# Patient Record
Sex: Female | Born: 1985
Health system: Southern US, Community
[De-identification: ages and names within clinical notes are randomized; demographics above are authoritative.]

## PROBLEM LIST (undated history)

## (undated) ENCOUNTER — Inpatient Hospital Stay (HOSPITAL_COMMUNITY): Payer: Self-pay

## (undated) DIAGNOSIS — R87629 Unspecified abnormal cytological findings in specimens from vagina: Secondary | ICD-10-CM

## (undated) DIAGNOSIS — R87619 Unspecified abnormal cytological findings in specimens from cervix uteri: Secondary | ICD-10-CM

## (undated) DIAGNOSIS — IMO0002 Reserved for concepts with insufficient information to code with codable children: Secondary | ICD-10-CM

## (undated) DIAGNOSIS — R569 Unspecified convulsions: Secondary | ICD-10-CM

## (undated) DIAGNOSIS — Z8619 Personal history of other infectious and parasitic diseases: Secondary | ICD-10-CM

## (undated) DIAGNOSIS — Z789 Other specified health status: Secondary | ICD-10-CM

## (undated) DIAGNOSIS — B999 Unspecified infectious disease: Secondary | ICD-10-CM

## (undated) HISTORY — DX: Reserved for concepts with insufficient information to code with codable children: IMO0002

## (undated) HISTORY — DX: Unspecified infectious disease: B99.9

## (undated) HISTORY — DX: Unspecified convulsions: R56.9

## (undated) HISTORY — PX: SUPERFICIAL LYMPH NODE BIOPSY / EXCISION: SUR127

## (undated) HISTORY — DX: Unspecified abnormal cytological findings in specimens from cervix uteri: R87.619

## (undated) HISTORY — PX: TONSILLECTOMY: SUR1361

## (undated) HISTORY — PX: OTHER SURGICAL HISTORY: SHX169

## (undated) HISTORY — PX: APPENDECTOMY: SHX54

## (undated) HISTORY — DX: Personal history of other infectious and parasitic diseases: Z86.19

## (undated) HISTORY — DX: Unspecified abnormal cytological findings in specimens from vagina: R87.629

---
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
## 2005-10-26 DIAGNOSIS — B999 Unspecified infectious disease: Secondary | ICD-10-CM

## 2005-10-26 HISTORY — DX: Unspecified infectious disease: B99.9

## 2012-10-26 NOTE — L&D Delivery Note (Signed)
Delivery Note At 9:11 PM a viable female, "Addison", was delivered via Vaginal, Spontaneous Delivery (Presentation: ;  ).  APGAR: 9, 9; weight .   Placenta status:  Spontaneous, intact .  Cord: 3 vessels with the following complications: CAN x 1, reduced over shoulders at delivery.  None.  Cord pH: NA  Anesthesia: None  Episiotomy: None Lacerations: Small right periurethral skin split--not bleeding, no repair required.  Left vaginal introital abrasion--not bleeding, no repair required. Suture Repair: None Est. Blood Loss (mL): 300 cc  Mom to postpartum.  Baby to skin to skin. Placenta to path due to growth lag.Marland Kitchen  Nigel Bridgeman 07/28/2013, 9:43 PM

## 2012-11-29 ENCOUNTER — Ambulatory Visit: Payer: BC Managed Care – PPO | Admitting: Obstetrics and Gynecology

## 2012-11-29 ENCOUNTER — Encounter: Payer: Self-pay | Admitting: Obstetrics and Gynecology

## 2012-11-29 DIAGNOSIS — O26849 Uterine size-date discrepancy, unspecified trimester: Secondary | ICD-10-CM

## 2012-11-29 DIAGNOSIS — Z331 Pregnant state, incidental: Secondary | ICD-10-CM

## 2012-11-29 LAB — OB RESULTS CONSOLE ABO/RH: RH Type: POSITIVE

## 2012-11-29 LAB — OB RESULTS CONSOLE HEPATITIS B SURFACE ANTIGEN: Hepatitis B Surface Ag: NEGATIVE

## 2012-11-29 LAB — POCT URINALYSIS DIPSTICK
Glucose, UA: NEGATIVE
Ketones, UA: NEGATIVE
Leukocytes, UA: NEGATIVE
Spec Grav, UA: <=1.005

## 2012-11-29 LAB — OB RESULTS CONSOLE ANTIBODY SCREEN: Antibody Screen: NEGATIVE

## 2012-11-29 NOTE — Progress Notes (Signed)
NOB interview.  States is currently in [employer] and needs restrictions and [facility] letters  Which were given.U/S for viability sched per protocol due to hSAB. NOB W/U with VL  12/22/12.

## 2012-11-30 LAB — GC/CHLAMYDIA PROBE AMP, URINE
Chlamydia, Swab/Urine, PCR: NEGATIVE
GC Probe Amp, Urine: NEGATIVE

## 2012-11-30 LAB — PRENATAL PANEL VII
Antibody Screen: NEGATIVE
Basophils Absolute: 0 10*3/uL (ref 0.0–0.1)
HIV: NONREACTIVE
Hepatitis B Surface Ag: NEGATIVE
Lymphocytes Relative: 33 % (ref 12–46)
Lymphs Abs: 2.5 10*3/uL (ref 0.7–4.0)
MCV: 90.8 fL (ref 78.0–100.0)
Neutro Abs: 4.4 10*3/uL (ref 1.7–7.7)
Neutrophils Relative %: 60 % (ref 43–77)
Platelets: 237 10*3/uL (ref 150–400)
RBC: 4.48 MIL/uL (ref 3.87–5.11)
RDW: 14.3 % (ref 11.5–15.5)
Rubella: 4.9 Index — ABNORMAL HIGH (ref <0.90)
WBC: 7.4 10*3/uL (ref 4.0–10.5)

## 2012-12-06 ENCOUNTER — Other Ambulatory Visit: Payer: BC Managed Care – PPO

## 2012-12-07 ENCOUNTER — Ambulatory Visit: Payer: BC Managed Care – PPO | Admitting: Obstetrics and Gynecology

## 2012-12-07 ENCOUNTER — Ambulatory Visit: Payer: BC Managed Care – PPO

## 2012-12-07 ENCOUNTER — Encounter: Payer: Self-pay | Admitting: Obstetrics and Gynecology

## 2012-12-07 ENCOUNTER — Other Ambulatory Visit: Payer: Self-pay | Admitting: Obstetrics and Gynecology

## 2012-12-07 VITALS — BP 102/64 | Wt 162.0 lb

## 2012-12-07 DIAGNOSIS — O26841 Uterine size-date discrepancy, first trimester: Secondary | ICD-10-CM

## 2012-12-07 DIAGNOSIS — Z331 Pregnant state, incidental: Secondary | ICD-10-CM

## 2012-12-07 DIAGNOSIS — O26849 Uterine size-date discrepancy, unspecified trimester: Secondary | ICD-10-CM

## 2012-12-07 LAB — POCT WET PREP (WET MOUNT)

## 2012-12-07 NOTE — Progress Notes (Signed)
[redacted]w[redacted]d unable to leave urine sample. Will give before leaving office.  No complaints.  U/s Comments: singleton pregnancy, [redacted]w[redacted]d IUP, yolk sac seen, normal ovaries, no fluid in CDS, normal adnexa. Suggest EDD be changed by todays u/s discrepancy in LMP EDD

## 2012-12-07 NOTE — Progress Notes (Signed)
[facility] NEW OB EXAMINATION   Elizabeth Chapman is a 27 y.o. female, G2P0010, who presents at [redacted]w[redacted]d gestation for a new obstetrical examination.  The patient reports that she has a history of irregular ovulation.  Her due date is August 01, 2013 based on an ultrasound today which showed a 6 week 1 day gestation.  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  The following portions of the patient's history were reviewed and updated as appropriate: allergies, current medications, past family history, past medical history, past social history, past surgical history and problem list.  OB History   Grav Para Term Preterm Abortions TAB SAB Ect Mult Living   2    1  1          Past Medical History  Diagnosis Date  . Abnormal Pap smear AGE 83    COLPO; LAST PAP 2011  . Infection     UTI X 1  . Infection 2007    X 1    Past Surgical History  Procedure Laterality Date  . Appendectomy  AGE 56  . Lymph nodes removed from neck  AGE 56  . Tonsillectomy  AGE 23    Family History  Problem Relation Age of Onset  . Early death Father     WORK ACCIDENT  . Learning disabilities Sister   . Epilepsy Brother     DUE TO CAR ACCIDENT    Social History:  reports that she quit smoking about 6 years ago. Her smoking use included Cigarettes. She smoked 0.30 packs per day. She has never used smokeless tobacco. She reports that  drinks alcohol. She reports that she does not use illicit drugs.  Allergies: No Known Allergies  Medications: prenatal vitamins   Objective:    BP 102/64  Wt 162 lb (73.483 kg)  LMP 10/14/2012    Weight:  Wt Readings from Last 1 Encounters:  12/07/12 162 lb (73.483 kg)          BMI: There is no height on file to calculate BMI.  General Appearance: Alert, appropriate appearance for age. No acute distress HEENT: Grossly normal Neck / Thyroid: Supple, no masses, nodes or enlargement Lungs: clear to auscultation  bilaterally Back: No CVA tenderness Breast Exam: No masses or nodes.No dimpling, nipple retraction or discharge. Cardiovascular: Regular rate and rhythm. S1, S2, no murmur Gastrointestinal: Soft, non-tender, no masses or organomegaly.                               Fundal height: 6 weeks                               Fetal heart tones audible: no  ++++++++++++++++++++++++++++++++++++++++++++++++++++++++  Pelvic Exam: External genitalia: normal general appearance Vaginal: normal without tenderness, induration or masses and relaxation: No Cervix: normal appearance Adnexa: normal bimanual exam Uterus: gravid, nontender, 6 weeks size  ++++++++++++++++++++++++++++++++++++++++++++++++++++++++  Lymphatic Exam: Non-palpable nodes in neck, clavicular, axillary, or inguinal regions Neurologic: Normal speech, no tremor  Psychiatric: Alert and oriented, appropriate affect.  Prenatal labs: ABO, Rh: O/POS/-- (02/04 1134) Antibody: NEG (02/04 1134) Rubella:  immune RPR: NON REAC (02/04 1134)  HBsAg: NEGATIVE (02/04 1134)  HIV: NON REACTIVE (02/04 1134)  GBS:   pending until the third trimester Hemoglobin: 14.2 Platelets: 237,000 Gonorrhea: Negative Chlamydia: Negative Urine culture: Negative Ultrasound  today: December 07, 2012 single gestation, fetal heart rate 101 bpm, normal ovaries, no fluid in the cul-de-sac.  Wet Prep:   Previously done:            no                     If no: Whiff:                     Positive slight                              Clue cells:             no                              PH:                        4.5                              Yeast:                    no                              Trichomoniasis:    no  Urine analysis:     Negative    Assessment:   27 y.o. female G2P0010 at [redacted]w[redacted]d gestation ( EDC is August 01, 2013) by:  Ultrasound:                               yes                                Military service  Past history of  cigarette smoking   Plan:    Pap smear sent.  Our office will complete appropriate military paperwork.  We discussed routine pregnancy issues:  Toxoplasmosis was reviewed.  The patient was told to avoid cat liter boxes and feces.  The patient was told to avoid predator fish including tuna because of our concerns for mercury consumption.  The patient was told to avoid soft cheeses.  The patient was told to be sure that all lunch meats are well cooked.  Our model for pregnancy management was reviewed.  Proper diet and exercise reviewed.  Return to office in 4 weeks.  Medications include:  Prenatal vitamins  Mylinda Latina.D.

## 2012-12-09 LAB — PAP IG W/ RFLX HPV ASCU

## 2012-12-10 ENCOUNTER — Other Ambulatory Visit: Payer: Self-pay

## 2012-12-15 LAB — US OB TRANSVAGINAL

## 2012-12-15 LAB — US OB COMP LESS 14 WKS

## 2012-12-22 ENCOUNTER — Encounter: Payer: BC Managed Care – PPO | Admitting: Obstetrics and Gynecology

## 2013-01-05 ENCOUNTER — Ambulatory Visit: Payer: BC Managed Care – PPO

## 2013-01-05 ENCOUNTER — Ambulatory Visit: Payer: BC Managed Care – PPO | Admitting: Obstetrics and Gynecology

## 2013-07-06 LAB — OB RESULTS CONSOLE GBS: GBS: NEGATIVE

## 2013-07-23 ENCOUNTER — Encounter (HOSPITAL_COMMUNITY): Payer: Self-pay | Admitting: Family

## 2013-07-23 ENCOUNTER — Inpatient Hospital Stay (HOSPITAL_COMMUNITY)
Admission: AD | Admit: 2013-07-23 | Discharge: 2013-07-23 | Disposition: A | Discharge status: Home / Self Care | Payer: BC Managed Care – PPO | Source: Ambulatory Visit | Attending: Obstetrics and Gynecology | Admitting: Obstetrics and Gynecology

## 2013-07-23 DIAGNOSIS — R51 Headache: Secondary | ICD-10-CM | POA: Insufficient documentation

## 2013-07-23 DIAGNOSIS — R030 Elevated blood-pressure reading, without diagnosis of hypertension: Secondary | ICD-10-CM | POA: Insufficient documentation

## 2013-07-23 DIAGNOSIS — O99891 Other specified diseases and conditions complicating pregnancy: Secondary | ICD-10-CM | POA: Insufficient documentation

## 2013-07-23 LAB — CBC
HCT: 33.4 % — ABNORMAL LOW (ref 36.0–46.0)
Hemoglobin: 11.8 g/dL — ABNORMAL LOW (ref 12.0–15.0)
MCH: 31.4 pg (ref 26.0–34.0)
MCHC: 35.3 g/dL (ref 30.0–36.0)
MCV: 88.8 fL (ref 78.0–100.0)

## 2013-07-23 LAB — URINALYSIS, ROUTINE W REFLEX MICROSCOPIC
Bilirubin Urine: NEGATIVE
Hgb urine dipstick: NEGATIVE
Ketones, ur: NEGATIVE mg/dL
Nitrite: NEGATIVE
pH: 6.5 (ref 5.0–8.0)

## 2013-07-23 LAB — COMPREHENSIVE METABOLIC PANEL
BUN: 13 mg/dL (ref 6–23)
Calcium: 9.8 mg/dL (ref 8.4–10.5)
Creatinine, Ser: 0.94 mg/dL (ref 0.50–1.10)
GFR calc Af Amer: >90 mL/min (ref >90)
GFR calc non Af Amer: 82 mL/min — ABNORMAL LOW (ref >90)
Glucose, Bld: 95 mg/dL (ref 70–99)
Sodium: 138 mEq/L (ref 135–145)
Total Protein: 5.8 g/dL — ABNORMAL LOW (ref 6.0–8.3)

## 2013-07-23 LAB — PROTEIN / CREATININE RATIO, URINE
Protein Creatinine Ratio: 0.14 (ref 0.00–0.15)
Total Protein, Urine: 5.6 mg/dL

## 2013-07-23 LAB — LACTATE DEHYDROGENASE: LDH: 152 U/L (ref 94–250)

## 2013-07-23 LAB — URIC ACID: Uric Acid, Serum: 5.9 mg/dL (ref 2.4–7.0)

## 2013-07-23 MED ORDER — HYDROCODONE-ACETAMINOPHEN 5-325 MG PO TABS
1.0000 | ORAL_TABLET | Freq: Once | ORAL | Status: AC
  Administered 2013-07-23: 1 via ORAL
  Filled 2013-07-23: qty 1

## 2013-07-23 NOTE — MAU Provider Note (Signed)
History   27yo, G2P0 at [redacted]w[redacted]d presents with HA since Friday generally unrelieved by tylenol, though had some relief yesterday.  Pt reports that her BP had been closely monitored in the office and that [facility] labs had been drawn 9/23.  Denies visual changes and right epigastric pain.  Denies VB, UCs, LOF, recent fever, resp or GI c/o's,UTI s/s . GFM.   Chief Complaint  Patient presents with  . Headache    OB History   Grav Para Term Preterm Abortions TAB SAB Ect Mult Living   2    1  1          Past Medical History  Diagnosis Date  . Abnormal Pap smear AGE 87    COLPO; LAST PAP 2011  . Infection     UTI X 1  . Infection 2007    X 1    Past Surgical History  Procedure Laterality Date  . Appendectomy  AGE 59  . Lymph nodes removed from neck  AGE 59  . Tonsillectomy  AGE 80    Family History  Problem Relation Age of Onset  . Early death Father     WORK ACCIDENT  . Learning disabilities Sister   . Epilepsy Brother     DUE TO CAR ACCIDENT    History  Substance Use Topics  . Smoking status: Former Smoker -- 0.30 packs/day    Types: Cigarettes    Quit date: 10/26/2006  . Smokeless tobacco: Never Used  . Alcohol Use: Yes     Comment: OCC    Allergies: No Known Allergies  Prescriptions prior to admission  Medication Sig Dispense Refill  . acetaminophen (TYLENOL) 500 MG tablet Take 1,000 mg by mouth every 6 (six) hours as needed for pain.      . Prenatal Vit-Fe Sulfate-FA (PRENATAL VITAMIN PO) Take 1 tablet by mouth daily. OTC        ROS: see HPI above, all other systems are negative   Physical Exam   Blood pressure 122/74, pulse 76, temperature 97.5 F (36.4 C), temperature source Oral, resp. rate 16, last menstrual period 10/14/2012.  Chest: Clear Heart: RRR Abdomen: gravid, NT Extremities: WNL  FHT: UCs:  Recent Results (from the past 2160 hour(s))  URINALYSIS, ROUTINE W REFLEX MICROSCOPIC     Status: Abnormal   Collection Time    07/23/13 10:41 AM       Result Value Range   Color, Urine YELLOW  YELLOW   APPearance CLEAR  CLEAR   Specific Gravity, Urine <1.005 (*) 1.005 - 1.030   pH 6.5  5.0 - 8.0   Glucose, UA NEGATIVE  NEGATIVE mg/dL   Hgb urine dipstick NEGATIVE  NEGATIVE   Bilirubin Urine NEGATIVE  NEGATIVE   Ketones, ur NEGATIVE  NEGATIVE mg/dL   Protein, ur NEGATIVE  NEGATIVE mg/dL   Urobilinogen, UA 0.2  0.0 - 1.0 mg/dL   Nitrite NEGATIVE  NEGATIVE   Leukocytes, UA NEGATIVE  NEGATIVE   Comment: MICROSCOPIC NOT DONE ON URINES WITH NEGATIVE PROTEIN, BLOOD, LEUKOCYTES, NITRITE, OR GLUCOSE <1000 mg/dL.  PROTEIN / CREATININE RATIO, URINE     Status: None   Collection Time    07/23/13 10:41 AM      Result Value Range   Creatinine, Urine 39.43     Total Protein, Urine 5.6     Comment: NO NORMAL RANGE ESTABLISHED FOR THIS TEST   PROTEIN CREATININE RATIO 0.14  0.00 - 0.15   Comment: Performed at [facility]  CBC     Status: Abnormal   Collection Time    07/23/13 11:17 AM      Result Value Range   WBC 10.4  4.0 - 10.5 K/uL   RBC 3.76 (*) 3.87 - 5.11 MIL/uL   Hemoglobin 11.8 (*) 12.0 - 15.0 g/dL   HCT 45.4 (*) 09.8 - 11.9 %   MCV 88.8  78.0 - 100.0 fL   MCH 31.4  26.0 - 34.0 pg   MCHC 35.3  30.0 - 36.0 g/dL   RDW 14.7  82.9 - 56.2 %   Platelets 164  150 - 400 K/uL  COMPREHENSIVE METABOLIC PANEL     Status: Abnormal   Collection Time    07/23/13 11:17 AM      Result Value Range   Sodium 138  135 - 145 mEq/L   Potassium 4.4  3.5 - 5.1 mEq/L   Chloride 103  96 - 112 mEq/L   CO2 23  19 - 32 mEq/L   Glucose, Bld 95  70 - 99 mg/dL   BUN 13  6 - 23 mg/dL   Creatinine, Ser 1.30  0.50 - 1.10 mg/dL   Calcium 9.8  8.4 - 86.5 mg/dL   Total Protein 5.8 (*) 6.0 - 8.3 g/dL   Albumin 2.7 (*) 3.5 - 5.2 g/dL   AST 18  0 - 37 U/L   ALT 10  0 - 35 U/L   Alkaline Phosphatase 101  39 - 117 U/L   Total Bilirubin 0.2 (*) 0.3 - 1.2 mg/dL   GFR calc non Af Amer 82 (*) >90 mL/min   GFR calc Af Amer >90  >90 mL/min   Comment: (NOTE)      The eGFR has been calculated using the CKD EPI equation.     This calculation has not been validated in all clinical situations.     eGFR's persistently <90 mL/min signify possible Chronic Kidney     Disease.  URIC ACID     Status: None   Collection Time    07/23/13 11:17 AM      Result Value Range   Uric Acid, Serum 5.9  2.4 - 7.0 mg/dL  LACTATE DEHYDROGENASE     Status: None   Collection Time    07/23/13 11:17 AM      Result Value Range   LDH 152  94 - 250 U/L   Filed Vitals:   07/23/13 1050 07/23/13 1101 07/23/13 1116 07/23/13 1131  BP: 151/87 126/80 134/89 122/74  Pulse: 67 69 85 76  Temp: 97.5 F (36.4 C)     TempSrc: Oral     Resp: 16      ED Course  IUP at [redacted]w[redacted]d Pre-eclampsia evaluation PIH labs WNL  D/C home with PIH precautions F/u 9/30 at already scheduled ROB    Haroldine Laws CNM, MSN 07/23/2013 2:27 PM

## 2013-07-23 NOTE — MAU Note (Signed)
27 yo, G2P0 at [redacted]w[redacted]d, presents to MAU with c/o headache since Friday. Reports some relief yesterday; unrelieved with 1000mg  Tylenol at 0700 today and ice pack to head. Patient reports she had elevated BPs in office visit on Tuesday.  Denies blurred vision, RUQ pain or other lateralizing s/s.  Denies VB, LOF, contractions. Reports +FM.

## 2013-07-28 ENCOUNTER — Inpatient Hospital Stay (HOSPITAL_COMMUNITY)
Admission: AD | Admit: 2013-07-28 | Discharge: 2013-07-28 | Disposition: A | Discharge status: Home / Self Care | Payer: BC Managed Care – PPO | Source: Ambulatory Visit | Attending: Obstetrics and Gynecology | Admitting: Obstetrics and Gynecology

## 2013-07-28 ENCOUNTER — Encounter (HOSPITAL_COMMUNITY): Payer: Self-pay | Admitting: *Deleted

## 2013-07-28 ENCOUNTER — Inpatient Hospital Stay (HOSPITAL_COMMUNITY)
Admission: AD | Admit: 2013-07-28 | Discharge: 2013-07-31 | DRG: 775 | Disposition: A | Discharge status: Home / Self Care | Payer: Medicaid Other | Source: Ambulatory Visit | Attending: Obstetrics and Gynecology | Admitting: Obstetrics and Gynecology

## 2013-07-28 DIAGNOSIS — O26849 Uterine size-date discrepancy, unspecified trimester: Secondary | ICD-10-CM | POA: Diagnosis present

## 2013-07-28 DIAGNOSIS — O99891 Other specified diseases and conditions complicating pregnancy: Secondary | ICD-10-CM | POA: Insufficient documentation

## 2013-07-28 DIAGNOSIS — O479 False labor, unspecified: Secondary | ICD-10-CM | POA: Insufficient documentation

## 2013-07-28 DIAGNOSIS — IMO0001 Reserved for inherently not codable concepts without codable children: Secondary | ICD-10-CM

## 2013-07-28 DIAGNOSIS — R030 Elevated blood-pressure reading, without diagnosis of hypertension: Secondary | ICD-10-CM | POA: Insufficient documentation

## 2013-07-28 HISTORY — DX: Other specified health status: Z78.9

## 2013-07-28 LAB — CBC
MCH: 31.9 pg (ref 26.0–34.0)
MCHC: 35.2 g/dL (ref 30.0–36.0)
Platelets: 197 10*3/uL (ref 150–400)
RBC: 3.89 MIL/uL (ref 3.87–5.11)

## 2013-07-28 LAB — TYPE AND SCREEN: Antibody Screen: NEGATIVE

## 2013-07-28 LAB — RPR: RPR Ser Ql: NONREACTIVE

## 2013-07-28 LAB — ABO/RH: ABO/RH(D): O POS

## 2013-07-28 MED ORDER — FENTANYL CITRATE 0.05 MG/ML IJ SOLN
100.0000 ug | INTRAMUSCULAR | Status: DC | PRN
  Administered 2013-07-28: 100 ug via INTRAVENOUS
  Filled 2013-07-28: qty 2

## 2013-07-28 MED ORDER — ACETAMINOPHEN 325 MG PO TABS: 650.0000 mg | ORAL_TABLET | ORAL | Status: DC | PRN

## 2013-07-28 MED ORDER — CITRIC ACID-SODIUM CITRATE 334-500 MG/5ML PO SOLN: 30.0000 mL | ORAL | Status: DC | PRN

## 2013-07-28 MED ORDER — OXYCODONE-ACETAMINOPHEN 5-325 MG PO TABS: 1.0000 | ORAL_TABLET | ORAL | Status: DC | PRN

## 2013-07-28 MED ORDER — LIDOCAINE HCL (PF) 1 % IJ SOLN
30.0000 mL | INTRAMUSCULAR | Status: DC | PRN
  Filled 2013-07-28 (×2): qty 30

## 2013-07-28 MED ORDER — LACTATED RINGERS IV SOLN: 500.0000 mL | INTRAVENOUS | Status: DC | PRN

## 2013-07-28 MED ORDER — ONDANSETRON HCL 4 MG/2ML IJ SOLN: 4.0000 mg | Freq: Four times a day (QID) | INTRAMUSCULAR | Status: DC | PRN

## 2013-07-28 MED ORDER — FLEET ENEMA 7-19 GM/118ML RE ENEM: 1.0000 | ENEMA | RECTAL | Status: DC | PRN

## 2013-07-28 MED ORDER — IBUPROFEN 600 MG PO TABS
600.0000 mg | ORAL_TABLET | Freq: Four times a day (QID) | ORAL | Status: DC | PRN
  Administered 2013-07-28: 600 mg via ORAL
  Filled 2013-07-28: qty 1

## 2013-07-28 MED ORDER — LACTATED RINGERS IV SOLN
INTRAVENOUS | Status: DC
  Administered 2013-07-28: 16:00:00 via INTRAVENOUS

## 2013-07-28 MED ORDER — OXYTOCIN 40 UNITS IN LACTATED RINGERS INFUSION - SIMPLE MED
62.5000 mL/h | INTRAVENOUS | Status: DC
  Filled 2013-07-28: qty 1000

## 2013-07-28 MED ORDER — ZOLPIDEM TARTRATE 5 MG PO TABS
10.0000 mg | ORAL_TABLET | Freq: Once | ORAL | Status: AC
  Administered 2013-07-28: 10 mg via ORAL
  Filled 2013-07-28 (×2): qty 1

## 2013-07-28 MED ORDER — ZOLPIDEM TARTRATE 10 MG PO TABS: 10.0000 mg | ORAL_TABLET | Freq: Once | ORAL | Status: DC

## 2013-07-28 MED ORDER — OXYTOCIN BOLUS FROM INFUSION: 500.0000 mL | INTRAVENOUS | Status: DC

## 2013-07-28 NOTE — MAU Provider Note (Signed)
  History     CSN: [id number]  Arrival date and time: 07/28/13 0350   None     Chief Complaint  Patient presents with  . Labor Eval   HPI Comments: Pt is a G2P0 at [redacted]w[redacted]d that arrives for labor check, states she's been having ctx since 5am yesterday and they have been 5 min apart since about 2am, also getting stronger. Pt denies any LOF or VB, +FM.      No past medical history on file.  No past surgical history on file.  No family history on file.  History  Substance Use Topics  . Smoking status: Not on file  . Smokeless tobacco: Not on file  . Alcohol Use: Not on file    Allergies: Allergies not on file  No prescriptions prior to admission    Review of Systems  All other systems reviewed and are negative.   Physical Exam   Blood pressure 132/76, pulse 76, temperature 98.6 F (37 C), temperature source Oral, resp. rate 20, height 5\' 3"  (1.6 m), weight 212 lb (96.163 kg), SpO2 100.00%.  Physical Exam  Nursing note and vitals reviewed. Constitutional: She is oriented to person, place, and time. She appears well-developed and well-nourished. No distress.  Cardiovascular: Normal rate.   Respiratory: Effort normal.  GI: Soft.  Genitourinary: Vagina normal.  Musculoskeletal: Normal range of motion. She exhibits edema.  Neurological: She is alert and oriented to person, place, and time. She has normal reflexes.  Skin: Skin is warm and dry.  Psychiatric: She has a normal mood and affect. Her behavior is normal.    MAU Course  Procedures    Assessment and Plan  IUP at [redacted]w[redacted]d FHR reactive cat 1 Toco irreg Cervix FT, no change after observation Initially elevated BP, but has normalized Hx growth restriction, AC 6th% on last [place] but overall growth normal at 65% W/u for pre-eclampsia normal on 9/23  Pt already scheduled for growth [place] and BPP on 10/7  ambien 10mg  PO given here and rx for #15 w 0RF given  Discharged home stable condition rv'd FKC and labor sx's    Lanae Federer M 07/28/2013, 5:59 AM

## 2013-07-28 NOTE — Progress Notes (Signed)
  Subjective: Breathing with contractions--received IV Fentanyl at 5:30pm with benefit.  "Feels like it's wearing off now".  Moderately anxious.  Objective: BP 136/80  Pulse 77  Temp(Src) 97.1 F (36.2 C) (Oral)  Resp 20  Ht 5\' 3"  (1.6 m)  Wt 212 lb (96.163 kg)  BMI 37.56 kg/m2  SpO2 98%      FHT:  Category 1 UC:   q 4 min, moderate SVE:   Dilation: 8 Effacement (%): 100 Station: -1 Exam by:: Emilee Hero CNM On initial exam, cervix fully dilated with BBOW, vtx at -1 station. AROM--clear fluid, with cervix reducing to 8 cm, 100%, vtx, -1  Assessment / Plan: Progressive labor. Will anticipate further descent and dilation.  Nigel Bridgeman 07/28/2013, 7:48 PM

## 2013-07-28 NOTE — H&P (Signed)
Elizabeth Chapman is a 27 y.o. female, G2P0010 at [redacted]w[redacted]d, presenting for active labor.  Denies VB, LOF, recent fever, resp or GI c/o's, UTI or PIH s/s. GFM. Desires epidural.  Patient Active Problem List   Diagnosis Date Noted  . Uterine size date discrepancy 07/28/2013  . Active labor at term 07/28/2013    History of present pregnancy: Patient entered care at 11 weeks.   EDC of 08/01/13 was established by LMP.   Anatomy scan:  20 weeks, with normal findings and an posterior placenta.   Additional [place] evaluations:  [redacted]w[redacted]d for growth S<D - AFI 60th%ile, EFW 35th%ile, AC 5th%ile Significant prenatal events:  none   Last evaluation:  07/25/13 at [redacted]w[redacted]d    No cervical exam  OB History   Grav Para Term Preterm Abortions TAB SAB Ect Mult Living   2    1  1         Past Medical History  Diagnosis Date  . Medical history non-contributory    Past Surgical History  Procedure Laterality Date  . Superficial lymph node biopsy / excision    . Appendectomy    . Tonsillectomy     Family History: family history is not on file. Social History:  reports that she quit smoking about 4 years ago. She does not have any smokeless tobacco history on file. She reports that she does not drink alcohol or use illicit drugs.   Prenatal Transfer Tool  Maternal Diabetes: No Genetic Screening: Declined Maternal Ultrasounds/Referrals: Normal Fetal Ultrasounds or other Referrals:  None Maternal Substance Abuse:  No Significant Maternal Medications:  None Significant Maternal Lab Results: Lab values include: Group B Strep negative    ROS: see HPI above, all other systems are negative  No Known Allergies   Dilation: 5.5 Effacement (%): 100 Station: -2 Exam by:: K.WIlson,RN Blood pressure 135/83, pulse 83, temperature 97.1 F (36.2 C), temperature source Oral, resp. rate 14, SpO2 98.00%.  Chest clear Heart RRR without murmur Abd gravid, NT Ext: WNL  FHR: Cat II UCs:  Q 2-5 min  Prenatal labs: ABO,  Rh:  O pos Antibody:  neg Rubella:   Immune RPR:   Neg HBsAg:   Neg HIV:   Neg GBS:  Neg Sickle cell/Hgb electrophoresis:  n/a Pap:  2/14 WNL GC:  Neg Chlamydia:  Neg Genetic screenings:  Declined Glucola:  101 Other:  PIH labs 9/28 and 9/23 - WNL    Assessment/Plan: IUP at [redacted]w[redacted]d Active labor GBS neg AC lag 5th%ile  Admit per c/w Dr. Estanislado Pandy as attending MD Routine [facility] orders Epidural prn   Rowan Blase, MSN 07/28/2013, 6:09 PM

## 2013-07-28 NOTE — MAU Note (Signed)
Pt here earlier this morning. given ambien. Did not sleep. Contractions worse now and thinks she lost her mucus plug

## 2013-07-28 NOTE — MAU Note (Signed)
contractions 

## 2013-07-28 NOTE — MAU Note (Signed)
PT SAYS SHE  HAS BEEN HURTING SINCE [place] LAST Tuesday-IN OFFICE -  DIDN'T SAY.   IS  [facility] THIS Tuesday FOR U/S- SAYS BABY IS SMALL.   Marland Kitchen DENIES HSV  AND MRSA.

## 2013-07-29 LAB — CBC
HCT: 30.2 % — ABNORMAL LOW (ref 36.0–46.0)
HCT: 32.7 % — ABNORMAL LOW (ref 36.0–46.0)
Hemoglobin: 10.7 g/dL — ABNORMAL LOW (ref 12.0–15.0)
Hemoglobin: 11.4 g/dL — ABNORMAL LOW (ref 12.0–15.0)
MCH: 32.2 pg (ref 26.0–34.0)
MCHC: 35.4 g/dL (ref 30.0–36.0)
MCV: 89.6 fL (ref 78.0–100.0)
MCV: 92.4 fL (ref 78.0–100.0)
Platelets: 189 10*3/uL (ref 150–400)
RBC: 3.37 MIL/uL — ABNORMAL LOW (ref 3.87–5.11)
RBC: 3.54 MIL/uL — ABNORMAL LOW (ref 3.87–5.11)
RDW: 14.3 % (ref 11.5–15.5)
WBC: 16 10*3/uL — ABNORMAL HIGH (ref 4.0–10.5)
WBC: 14.9 10*3/uL — ABNORMAL HIGH (ref 4.0–10.5)

## 2013-07-29 LAB — COMPREHENSIVE METABOLIC PANEL
AST: 29 U/L (ref 0–37)
Alkaline Phosphatase: 96 U/L (ref 39–117)
CO2: 25 mEq/L (ref 19–32)
Calcium: 9.3 mg/dL (ref 8.4–10.5)
Chloride: 99 mEq/L (ref 96–112)
Creatinine, Ser: 1.03 mg/dL (ref 0.50–1.10)
GFR calc Af Amer: 86 mL/min — ABNORMAL LOW (ref >90)
GFR calc non Af Amer: 74 mL/min — ABNORMAL LOW (ref >90)
Glucose, Bld: 103 mg/dL — ABNORMAL HIGH (ref 70–99)
Total Bilirubin: 0.2 mg/dL — ABNORMAL LOW (ref 0.3–1.2)
Total Protein: 6 g/dL (ref 6.0–8.3)

## 2013-07-29 LAB — PROTEIN / CREATININE RATIO, URINE
Creatinine, Urine: 204.2 mg/dL
Protein Creatinine Ratio: 0.08 (ref 0.00–0.15)

## 2013-07-29 LAB — URIC ACID: Uric Acid, Serum: 6 mg/dL (ref 2.4–7.0)

## 2013-07-29 MED ORDER — PRENATAL MULTIVITAMIN CH
1.0000 | ORAL_TABLET | Freq: Every day | ORAL | Status: DC
  Administered 2013-07-29 – 2013-07-31 (×3): 1 via ORAL
  Filled 2013-07-29 (×3): qty 1

## 2013-07-29 MED ORDER — DIPHENHYDRAMINE HCL 25 MG PO CAPS: 25.0000 mg | ORAL_CAPSULE | Freq: Four times a day (QID) | ORAL | Status: DC | PRN

## 2013-07-29 MED ORDER — DIBUCAINE 1 % RE OINT: 1.0000 "application " | TOPICAL_OINTMENT | RECTAL | Status: DC | PRN

## 2013-07-29 MED ORDER — BENZOCAINE-MENTHOL 20-0.5 % EX AERO: 1.0000 "application " | INHALATION_SPRAY | CUTANEOUS | Status: DC | PRN

## 2013-07-29 MED ORDER — INFLUENZA VAC SPLIT QUAD 0.5 ML IM SUSP
0.5000 mL | INTRAMUSCULAR | Status: AC
  Administered 2013-07-29: 0.5 mL via INTRAMUSCULAR

## 2013-07-29 MED ORDER — ONDANSETRON HCL 4 MG/2ML IJ SOLN: 4.0000 mg | INTRAMUSCULAR | Status: DC | PRN

## 2013-07-29 MED ORDER — IBUPROFEN 600 MG PO TABS
600.0000 mg | ORAL_TABLET | Freq: Four times a day (QID) | ORAL | Status: DC
  Administered 2013-07-29 – 2013-07-31 (×10): 600 mg via ORAL
  Filled 2013-07-29 (×9): qty 1

## 2013-07-29 MED ORDER — OXYCODONE-ACETAMINOPHEN 5-325 MG PO TABS
1.0000 | ORAL_TABLET | ORAL | Status: DC | PRN
Start: 2013-07-29 — End: 2013-07-31
  Administered 2013-07-30: 1 via ORAL
  Filled 2013-07-29: qty 1

## 2013-07-29 MED ORDER — WITCH HAZEL-GLYCERIN EX PADS: 1.0000 "application " | MEDICATED_PAD | CUTANEOUS | Status: DC | PRN

## 2013-07-29 MED ORDER — LANOLIN HYDROUS EX OINT: TOPICAL_OINTMENT | CUTANEOUS | Status: DC | PRN

## 2013-07-29 MED ORDER — SENNOSIDES-DOCUSATE SODIUM 8.6-50 MG PO TABS
2.0000 | ORAL_TABLET | ORAL | Status: DC
  Administered 2013-07-29 – 2013-07-31 (×2): 2 via ORAL

## 2013-07-29 MED ORDER — ONDANSETRON HCL 4 MG PO TABS: 4.0000 mg | ORAL_TABLET | ORAL | Status: DC | PRN

## 2013-07-29 MED ORDER — ZOLPIDEM TARTRATE 5 MG PO TABS: 5.0000 mg | ORAL_TABLET | Freq: Every evening | ORAL | Status: DC | PRN

## 2013-07-29 MED ORDER — TETANUS-DIPHTH-ACELL PERTUSSIS 5-2.5-18.5 LF-MCG/0.5 IM SUSP
0.5000 mL | Freq: Once | INTRAMUSCULAR | Status: AC
  Administered 2013-07-29: 0.5 mL via INTRAMUSCULAR

## 2013-07-29 MED ORDER — SIMETHICONE 80 MG PO CHEW: 80.0000 mg | CHEWABLE_TABLET | ORAL | Status: DC | PRN

## 2013-07-29 NOTE — Progress Notes (Signed)
Post Partum Day 1:S/P SVB, no repair required of small skin splits Subjective: Patient up ad lib, denies syncope or dizziness. Feeding:  Breast Contraceptive plan:   Undecided  Objective: Blood pressure 129/86, pulse 94, temperature 98.3 F (36.8 C), temperature source Oral, resp. rate 18, height 5\' 3"  (1.6 m), weight 212 lb (96.163 kg), SpO2 98.00%, unknown if currently breastfeeding.  Physical Exam:  General: alert Lochia: appropriate Uterine Fundus: firm Incision: Intact perineum DVT Evaluation: No evidence of DVT seen on physical exam. Negative Homan's sign.   Recent Labs  07/28/13 1555 07/29/13 0625  HGB 12.4 10.7*  HCT 35.2* 30.2*    Assessment/Plan: S/P Vaginal delivery day 1 Continue current care Anticipate d/c tomorrow.   LOS: 1 day   Caidence Higashi 07/29/2013, 7:31 AM

## 2013-07-30 MED ORDER — NIFEDIPINE ER 30 MG PO TB24
30.0000 mg | ORAL_TABLET | Freq: Once | ORAL | Status: AC
  Administered 2013-07-30: 30 mg via ORAL
  Filled 2013-07-30: qty 1

## 2013-07-30 NOTE — Progress Notes (Signed)
0623 BP 145/100.  Pt resting. Contacted Sanda Klein, CNM.  No orders given.  Will continue to monitor. Dahlia Byes Boschen

## 2013-07-30 NOTE — Progress Notes (Signed)
Post Partum Day 2: S/P SVD, no repair required of small skin splits  Subjective: Patient up ad lib, denies syncope or dizziness. Feeding:  Breastfeeding Contraceptive plan:   undecided  Objective: Blood pressure 145/100, pulse 111, temperature 98.2 F (36.8 C), temperature source Oral, resp. rate 20, height 5\' 3"  (1.6 m), weight 212 lb (96.163 kg), SpO2 98.00%, unknown if currently breastfeeding.  Physical Exam:  General: alert, cooperative and no distress Lochia: appropriate Uterine Fundus: firm Incision: healing well DVT Evaluation: No evidence of DVT seen on physical exam. Negative Homan's sign.   Recent Labs  07/29/13 0625 07/29/13 2218  HGB 10.7* 11.4*  HCT 30.2* 32.7*   Filed Vitals:   07/29/13 1832 07/29/13 2040 07/30/13 0245 07/30/13 0623  BP: 140/95 155/93 147/85 145/100  Pulse: 91   111  Temp:    98.2 F (36.8 C)  TempSrc:    Oral  Resp: 18   20  Height:      Weight:      SpO2: 98%        Assessment/Plan: S/P Vaginal delivery day 2 Elevated BPs  Rx Procardia 30 mg QD Monitor BPs May consider d/c home today depending on BPs   LOS: 2 days   Jamorion Gomillion 07/30/2013, 9:08 AM

## 2013-07-30 NOTE — Progress Notes (Signed)
2040 BP 155/93 Pt asymptomatic. Contacted Sanda Klein CNM.  Lab and urine orders given.  Will continue to monitor.  Dahlia Byes Boschen

## 2013-07-30 NOTE — Lactation Note (Signed)
This note was copied from the chart of Elizabeth Tanazia Valeriano. Lactation Consultation Note: Follow up visit with mom. She reports that baby is nursing better. Sometimes it hurts for the first few minutes but then eases off. LS 8 by RN. Mom reports that baby fed "all night" Baby is now asleep in bassinet. No questions at present. To call prn  Patient Name: Elizabeth Chapman [id number] Date: 07/30/2013 Reason for consult: Follow-up assessment   Maternal Data    Feeding Feeding Type: Breast Milk  LATCH Score/Interventions     Lactation Tools Discussed/Used     Consult Status Consult Status: Complete    Pamelia Hoit 07/30/2013, 11:38 AM

## 2013-07-31 MED ORDER — NIFEDIPINE ER OSMOTIC RELEASE 30 MG PO TB24: 30.0000 mg | ORAL_TABLET | Freq: Every day | ORAL | Status: DC

## 2013-07-31 MED ORDER — OXYCODONE-ACETAMINOPHEN 5-325 MG PO TABS: 1.0000 | ORAL_TABLET | Freq: Four times a day (QID) | ORAL | Status: DC | PRN

## 2013-07-31 MED ORDER — NIFEDIPINE ER 30 MG PO TB24
30.0000 mg | ORAL_TABLET | Freq: Every day | ORAL | Status: AC
  Administered 2013-07-31: 30 mg via ORAL
  Filled 2013-07-31: qty 1

## 2013-07-31 MED ORDER — IBUPROFEN 600 MG PO TABS: 600.0000 mg | ORAL_TABLET | Freq: Four times a day (QID) | ORAL | Status: DC

## 2013-07-31 NOTE — Progress Notes (Signed)
Patient received initial dose of Procardia 30 mg po at 9:26am, for BPs 140s-150s/93-100 during night.  Initial plan had been to send her home if BP was responsive. BP then dropped to 106/55, with onset of moderate HA.  Patient had been caffeine-deprived x 24 hours, denied any other sx of swelling or epigastric pain. Pulse was 119.  Caffeine beverage was given, with pain med at that time--patient felt better after that, with next BP 128/83.  Dr. Estanislado Pandy was consulted, and the decision was made to defer d/c until the am of 07/31/13, with re-evaluation of BP and need for Procardia at that time. Patient agreeable with that plan.  Nigel Bridgeman, CNM 07/30/13 8p

## 2013-07-31 NOTE — Discharge Summary (Signed)
Obstetric Discharge Summary Reason for Admission: onset of labor Prenatal Procedures: ultrasound Intrapartum Procedures: spontaneous vaginal delivery Postpartum Procedures: BP control with procardia xl 30mg  Complications-Operative and Postpartum: none Hemoglobin  Date Value Range Status  07/29/2013 11.4* 12.0 - 15.0 g/dL Final     HCT  Date Value Range Status  07/29/2013 32.7* 36.0 - 46.0 % Final   Pt ready to go home.  She reported a mild HA earlier but gone now.  She thinks her HAs are unrelated to the procardia xl 30mg .  D/c instructions reviewed.  Pt will call to schedule appt in 1wk for BP check and the smart start nurse scheduled to go out to home in 48hrs.  Pt interested in Mirena and instructed to schedule appt in 5wks for Mirena prep and in 6wks for PP visit.  Physical Exam:  General: alert and no distress Lochia: appropriate Uterine Fundus: firm and NT DVT Evaluation: No evidence of DVT seen on physical exam.  Discharge Diagnoses: Term Pregnancy-delivered  Discharge Information: Date: 07/31/2013 Activity: pelvic rest Diet: routine Medications: Ibuprofen and Percocet and procardia Condition: stable Instructions: refer to practice specific booklet Discharge to: home Follow-up Information   Follow up with [facility] In 1 week. (1wk for BP check, 5wks for appt to prep for IUD at [facility] visit and 6wks for PP visit)    Specialty:  Obstetrics and Gynecology   Contact information:   [address] [place] [address] [phone]      Newborn Data: Live born female  Birth Weight: 5 lb 6.6 oz (2455 g) APGAR: 9, 9  Home with mother.  Purcell Nails 07/31/2013, 4:32 PM

## 2013-07-31 NOTE — Progress Notes (Signed)
Post Partum Day 3 Subjective: no complaints, up ad lib, voiding, tolerating PO and + flatus.  Pt is ready to go home.  She denies a HA since last night.  Pt ok with continuing on procardia.  Objective: Blood pressure 134/90, pulse 101, temperature 98.1 F (36.7 C), temperature source Oral, resp. rate 20, height 5\' 3"  (1.6 m), weight 96.163 kg (212 lb), SpO2 95.00%, unknown if currently breastfeeding.  Physical Exam:  General: alert and no distress Lochia: appropriate Uterine Fundus: firm and NT DVT Evaluation: No evidence of DVT seen on physical exam.   Recent Labs  07/29/13 0625 07/29/13 2218  HGB 10.7* 11.4*  HCT 30.2* 32.7*    Assessment/Plan: Discharge home, Breastfeeding and Contraception undecided Will monitor pt's sxs after her morning dose of procardia and discharge this afternoon if continuing to do well.   LOS: 3 days   Elizabeth Chapman Y 07/31/2013, 11:04 AM

## 2013-08-31 ENCOUNTER — Other Ambulatory Visit: Payer: Self-pay

## 2013-10-04 ENCOUNTER — Encounter (HOSPITAL_COMMUNITY): Payer: Self-pay | Admitting: *Deleted

## 2014-05-30 ENCOUNTER — Encounter (HOSPITAL_COMMUNITY): Payer: Self-pay | Admitting: *Deleted

## 2014-07-13 ENCOUNTER — Encounter (HOSPITAL_COMMUNITY): Payer: Self-pay

## 2014-07-13 ENCOUNTER — Other Ambulatory Visit: Payer: Self-pay | Admitting: Obstetrics and Gynecology

## 2014-07-13 ENCOUNTER — Inpatient Hospital Stay (HOSPITAL_COMMUNITY)
Admission: AD | Admit: 2014-07-13 | Discharge: 2014-07-13 | Disposition: A | Discharge status: Home / Self Care | Payer: BC Managed Care – PPO | Source: Ambulatory Visit | Attending: Obstetrics and Gynecology | Admitting: Obstetrics and Gynecology

## 2014-07-13 ENCOUNTER — Inpatient Hospital Stay (HOSPITAL_COMMUNITY): Payer: BC Managed Care – PPO

## 2014-07-13 DIAGNOSIS — O2620 Pregnancy care for patient with recurrent pregnancy loss, unspecified trimester: Secondary | ICD-10-CM

## 2014-07-13 DIAGNOSIS — O021 Missed abortion: Secondary | ICD-10-CM | POA: Diagnosis present

## 2014-07-13 DIAGNOSIS — Z8759 Personal history of other complications of pregnancy, childbirth and the puerperium: Secondary | ICD-10-CM

## 2014-07-13 DIAGNOSIS — IMO0002 Reserved for concepts with insufficient information to code with codable children: Secondary | ICD-10-CM

## 2014-07-13 NOTE — MAU Note (Signed)
Pt sent over from the office after [place] results showed no heartbeat.

## 2014-07-13 NOTE — MAU Note (Signed)
Patient states she was seen in the office today and had an ultrasound, diagnosed with an IUFD and sent to MAU to see the CNM to develop a plan of care. Patient denies pain or bleeding.

## 2014-07-13 NOTE — Discharge Instructions (Signed)
Intrauterine Fetal Demise °About one percent of normal, uncomplicated pregnancies end in fetal death (intrauterine fetal demise, IUFD). It is considered a fetal death when it occurs after the 20th week of pregnancy. It is considered a miscarriage when a fetal death occurs in the first 20 weeks. The mother's health is usually not in danger. Usually, there is nothing that can be done to prevent it. °CAUSES °· Often the cause is unknown. °· Examination of the stillborn fetus after delivery may show an abnormality in the umbilical cord. An exam my also show a problem with the placenta or fetus. These problems may include infections or a variety of birth defects and genetic disorders. °· The pregnancy continues for 42 weeks or later (post term pregnancy). °· Conditions in the mother such as diabetes, high blood pressure, and numerous other medical, physical or poor lifestyle choices (illegal drugs, alcohol, smoking) increase the risk for fetal death. Often, however, risk factors are unknown. °· Multiple pregnancies (twins or more) increase the risk of fetal death. °SYMPTOMS  °· The mother may not notice symptoms in the early stages of pregnancy. Learning what is wrong (diagnosis) is based on: °¨ The loss of baby's heart sounds. °¨ The lack of increasing belly (abdominal) growth. °¨ Ultrasound studies which suggest death of the fetus. °· In later stages of pregnancy, a woman may be aware of changes in the fetal movement (kicks), or that the movement has stopped. °RELATED COMPLICATIONS °· Disseminated intravascular coagulation (DIC) is a problem with blood clotting. This can result in severe bleeding and rarely develops late after fetal death. °· Infection of the products of the pregnancy (fetal materials). °· Increase bleeding from retained fetal parts or placenta. °TREATMENT  °· Treatment should be accomplished within 2 weeks of the discovered fetal death. °· To confirm the fetal death, diagnostic tests are done such  as: °¨ X-rays. °¨ Ultrasound. °¨ Amniotic fluid studies (looking at the fluid in the sac surrounding the baby). °· Most women, on learning that their fetus is dead, prefer early removal of the contents of the womb (uterus). In the first three months of pregnancy (first trimester), this is usually done by D and C or with suction curettage. Suction curettage is a technique used to remove the dead fetus and other tissue of the pregnancy from the uterus. It uses an instrument somewhat like a straw, connected by tubing to a machine, that your caregiver uses to suck out the dead contents of the uterus. NOTE: Suction curettage may be done in the second and third trimester after delivery of the dead fetus only to make sure there is no placental tissue left in the uterus but not to suction out the fetus. °· In the second trimester, treatment is more frequently accomplished with high doses of a drug, prostaglandin E (Prostin) suppositories or in combination with laminaria (as specialized seaweed product that absorbs moisture and expands to gradually stretch and open the cervix). Prostin (T) causes labor to start. °· In the third trimester, it may be accomplished with laminaria and misoprostol vaginal suppositories to induce labor. It may also be done with the drugs intravenous oxytocin plus prostaglandin E. °· If there was an infection involved with the fetal death, you will be given an antibiotic. You will be given Rho-gam if you are Rh negative and the baby is Rh positive (a vaccine to prevent Rh problems with a future pregnancy). An additional treatment option is to wait for spontaneous labor, which usually occurs within 2   weeks, but may be longer. This is called expectant therapy. °· Following removal of the products of the pregnancy, the stillborn fetus is usually examined by a specialist (pathologist) to determine if problems are present that may reoccur in another pregnancy. This can help plan future pregnancies. That  planning will also include treatment which will best guarantee a good outcome in future pregnancies. °· Your caregiver can also help you deal with feelings of loss, guilt, loneliness, anxiety, and hostility. Family and friends can be helpful. If severe grief lasts longer than several months, professional counseling may be helpful. Joining a grief support group may be useful. °· Any medicines prescribed will depend on the type of treatment received. °Other problems can be cared for with your caregivers. There may be discussions on whether or not to see, touch or photograph the infant, whether to name the infant, what to do with the remains (burial or cremation), and holding religious services. °HOME CARE INSTRUCTIONS  °· Restrictions are usually not necessary unless associated with the delivery choice. °· Sexual intercourse should be avoided for 4 to 6 weeks. Starting another pregnancy should be delayed several months, or as suggested by your caregiver. °· Do not use tampons or douche. °· Only take over-the-counter or prescription medicines for pain, discomfort, or fever as directed by your caregiver. Do not take aspirin it can cause you to bleed. Call your caregiver for a prescription for stronger pain medication if you need it. °· No special diet is necessary unless you have diabetes or other medical problems that require a special diet. °· Take showers instead of baths until your caregiver tells you it is okay. °· Ask your caregiver when you can return to driving and to your everyday activities. °· Make an appointment with your caregiver for follow up care. °PREVENTION  °· Eliminate any of the causes, if possible, that were found after evaluating the fetus. °· Control any medical problems you may have before or during the pregnancy. °· Avoid illegal drugs, alcohol and smoking. °· Maintain good prenatal care and follow your caregiver's treatment and advice. °· Report any concerns or unusual changes you notice  during your pregnancy. °· More frequent prenatal visits may be necessary with the next child. °SEEK MEDICAL CARE IF:  °· You develop abnormal vaginal discharge. °· You develop a temperature 102° F (38.9° C) or higher. °· You are getting dizzy and faint. °· You are feeling depressed. °SEEK IMMEDIATE MEDICAL CARE IF:  °During pregnancy: °· You fail to gain weight, or your abdomen is not increasing in size. °· Your unborn child appears to have less movement or stopped moving. Keep your medical conditions under control. °After delivery: °· You have heavy vaginal bleeding. °· You have chills and fever. °· You have chest pain. °· You have shortness of breath. °· You have pain or swelling or redness of your leg. °· Following the death of a fetus, you or a family member need help or emotional support in coping with the grief process. °MAKE SURE YOU:  °· Understand these instructions. °· Will watch your condition. °· Will get help right away if you are not doing well or get worse. °Document Released: 10/12/2005 Document Revised: 01/04/2012 Document Reviewed: 07/07/2007 °ExitCare® Patient Information ©2015 [employer]. This information is not intended to replace advice given to you by your health care provider. Make sure you discuss any questions you have with your health care provider. ° °

## 2014-07-13 NOTE — MAU Note (Signed)
Urine in lab 

## 2014-07-14 DIAGNOSIS — Z6830 Body mass index (BMI) 30.0-30.9, adult: Secondary | ICD-10-CM

## 2014-07-14 NOTE — H&P (Signed)
Elizabeth Chapman is a 28 y.o. female, [id number] @ 10.1 wks by uncertain LMP who presented to MAU to discuss u/s findings and review options.  Had 2nd dating/viability scan just prior to counseling that confirmed IUFD. Patient offered no complaints. Denies vaginal bleeding and abdominal pain.  Office u/s showed: Retroverted uterus, SIUP @ [redacted]w[redacted]d; no cardiac activity with normal ovaries and adnexas.  Patient Active Problem List   Diagnosis Date Noted  . Body mass index (BMI) of 30.0-30.9 in adult 07/14/2014  . IUFD (intrauterine fetal death) 02-Aug-2014  . Pregnancy complication, habitual aborter 08-02-14  . History of prior pregnancy with SGA newborn 08/02/2014  Closely spaced pregnancies - last delivery 07/28/13  Pertinent Gynecological History: Menses: Irregular - LMP 05/03/14 Bleeding: None Contraception: none. Used Nuvaring from 09/08/2013 to 10/24/13 then switched to Micronor due to breastfeeding. +UPT on 06/28/14. Sexually transmitted diseases: no past history Previous GYN Procedures: Colpo at age 79  Last mammogram: NA Last pap: Normal Date: 12/07/12 - BV noted; not treated OB History: [id number], ? H/O fibroids   MEDICAL/FAMILY/SOCIAL HX: Patient's last menstrual period was 05/03/2014. LMP was unsure, yielding an EDD of 02/07/15.  Pt entered care at 8.0 wks. Dating and viability scan completed on August 02, 2014 = No cardiac activity seen, fetus measured [redacted]w[redacted]d.     Past Medical History  Diagnosis Date  . Medical history non-contributory   . Abnormal Pap smear AGE 50    COLPO; LAST PAP 2011  . Infection     UTI X 1  . Infection 2007    X 1  FOB African American w/ h/o +SCT  Past Surgical History  Procedure Laterality Date  . Superficial lymph node biopsy / excision    . Appendectomy    . Tonsillectomy    . Appendectomy  AGE 61  . Lymph nodes removed from neck  AGE 61  . Tonsillectomy  AGE 37    Family History  Problem Relation Age of Onset  . Early death Father     WORK ACCIDENT  .  Learning disabilities Sister   . Epilepsy Brother     DUE TO CAR ACCIDENT   OB HX: SVD 07/28/13 ? SAB x 2  Last evaluation: @ 8 wks on 06/28/14 for NOB w/u. Normal cervix, normal BP, +UPT.   Social History:  reports that she quit smoking about 7 years ago. Her smoking use included Cigarettes. She smoked 0.30 packs per day. She has never used smokeless tobacco. She reports that she drinks alcohol. She reports that she does not use illicit drugs.Reports exposure to passive cigarette smoke. She is a Caucasian female who is in the [employer]. She is married to [place] who is present and supportive.   ALLERGIES/MEDS:  Allergies: No Known Allergies  No prescriptions prior to admission     ROS - VB - Pain  Unsure LMP on 05/03/14. Pt. Currently breastfeeding her 19+ month old daughter.   Physical Exam Gen: Tearful Lungs: CTA Bilat CV: RRR, no murmur, gallop, click or rub Abdomen: Gravid, soft, NTND Pelvic: Deferred Ext: 2+DTRs, no clonus, no edema  U/S at [facility] as follows:   US Ob Comp Less 14 Wks  02-Aug-2014   CLINICAL DATA:  Concern for intrauterine fetal demise  EXAM: OBSTETRIC <14 WK [place] AND TRANSVAGINAL OB US  TECHNIQUE: Both transabdominal and transvaginal ultrasound examinations were performed for complete evaluation of the gestation as well as the maternal uterus, adnexal regions, and pelvic cul-de-sac. Transvaginal technique was performed to assess early pregnancy.  COMPARISON:  None.  FINDINGS: Intrauterine gestational sac: Visualized/normal in shape.  Yolk sac:  Visualize  Embryo:  Visualized  Cardiac Activity: Not visualized  CRL:   19  mm   8 w 4 d  Maternal uterus/adnexae: There is no demonstrable subchorionic hemorrhage. Cervical os is closed. No intrauterine mass. Both maternal ovaries appear normal. No extrauterine pelvic mass or free fluid.  IMPRESSION: Findings meet definitive criteria for failed pregnancy. This follows SRU consensus guidelines: Diagnostic Criteria for  Nonviable Pregnancy Early in the First Trimester. [facility] Mis J Med [phone]. No cardiac motion identified.  These results will be called to the ordering clinician or representative by the Radiologist Assistant, and communication documented in the PACS or zVision Dashboard.   Electronically Signed   By: Bretta Bang M.D.   On: 07/13/2014 14:13   US Ob Transvaginal  07/13/2014   CLINICAL DATA:  Concern for intrauterine fetal demise  EXAM: OBSTETRIC <14 WK [place] AND TRANSVAGINAL OB US  TECHNIQUE: Both transabdominal and transvaginal ultrasound examinations were performed for complete evaluation of the gestation as well as the maternal uterus, adnexal regions, and pelvic cul-de-sac. Transvaginal technique was performed to assess early pregnancy.  COMPARISON:  None.  FINDINGS: Intrauterine gestational sac: Visualized/normal in shape.  Yolk sac:  Visualize  Embryo:  Visualized  Cardiac Activity: Not visualized  CRL:   19  mm   8 w 4 d  Maternal uterus/adnexae: There is no demonstrable subchorionic hemorrhage. Cervical os is closed. No intrauterine mass. Both maternal ovaries appear normal. No extrauterine pelvic mass or free fluid.  IMPRESSION: Findings meet definitive criteria for failed pregnancy. This follows SRU consensus guidelines: Diagnostic Criteria for Nonviable Pregnancy Early in the First Trimester. [facility] Mis J Med [phone]. No cardiac motion identified.  These results will be called to the ordering clinician or representative by the Radiologist Assistant, and communication documented in the PACS or zVision Dashboard.   Electronically Signed   By: Bretta Bang M.D.   On: 07/13/2014 14:13        Prenatal Labs: ABO: O+ (06/28/14)  ABS: Neg (07/28/13 @ 1555) RPR: NR (07/28/13 @ 1555) NOB hemoglobin 13.3 (06/28/14) Other: urine culture neg on 06/28/14 GC/CT: Neg on 06/28/14   ASSESSMENT: Missed AB at 8.2 wks Asymptomatic Elevated BMI (30.1)  PLAN: Reviewed both ultrasounds and options  for management in detail with patient and spouse. Pt desired expectant management initially and called approximately 1 hour after discussion and stated she wanted a D&C ASAP. Shared pt's desire w/ Dr. Estanislado Pandy who scheduled pt for surgery on Monday at 2:30 pm. Pt informed to arrive to MAU at 1 pm and advise MAU staff of her scheduled surgery. Pt informed of NPO after midnight. Strict bleeding precautions given. Pt. Verbalized understanding.    Sherre Scarlet CNM 07/14/2014, 3:48 PM  R/B/A discussed.  Questions answered and consent s/w.

## 2014-07-16 ENCOUNTER — Ambulatory Visit (HOSPITAL_COMMUNITY)
Admission: RE | Admit: 2014-07-16 | Discharge: 2014-07-16 | Disposition: A | Discharge status: Home / Self Care | Payer: BC Managed Care – PPO | Source: Ambulatory Visit | Attending: Obstetrics and Gynecology | Admitting: Obstetrics and Gynecology

## 2014-07-16 ENCOUNTER — Encounter (HOSPITAL_COMMUNITY)
Admission: RE | Disposition: A | Discharge status: Home / Self Care | Payer: Self-pay | Source: Ambulatory Visit | Attending: Obstetrics and Gynecology

## 2014-07-16 ENCOUNTER — Encounter (HOSPITAL_COMMUNITY): Payer: BC Managed Care – PPO | Admitting: Anesthesiology

## 2014-07-16 ENCOUNTER — Encounter (HOSPITAL_COMMUNITY): Payer: Self-pay | Admitting: Anesthesiology

## 2014-07-16 ENCOUNTER — Ambulatory Visit (HOSPITAL_COMMUNITY): Payer: BC Managed Care – PPO | Admitting: Anesthesiology

## 2014-07-16 DIAGNOSIS — IMO0002 Reserved for concepts with insufficient information to code with codable children: Secondary | ICD-10-CM

## 2014-07-16 DIAGNOSIS — O021 Missed abortion: Secondary | ICD-10-CM | POA: Insufficient documentation

## 2014-07-16 DIAGNOSIS — O2620 Pregnancy care for patient with recurrent pregnancy loss, unspecified trimester: Secondary | ICD-10-CM

## 2014-07-16 DIAGNOSIS — Z6830 Body mass index (BMI) 30.0-30.9, adult: Secondary | ICD-10-CM

## 2014-07-16 DIAGNOSIS — Z8759 Personal history of other complications of pregnancy, childbirth and the puerperium: Secondary | ICD-10-CM

## 2014-07-16 DIAGNOSIS — Z87891 Personal history of nicotine dependence: Secondary | ICD-10-CM | POA: Diagnosis not present

## 2014-07-16 HISTORY — PX: DILATION AND EVACUATION: SHX1459

## 2014-07-16 LAB — CBC
HEMATOCRIT: 38.6 % (ref 36.0–46.0)
Hemoglobin: 14 g/dL (ref 12.0–15.0)
MCH: 32 pg (ref 26.0–34.0)
MCHC: 36.3 g/dL — ABNORMAL HIGH (ref 30.0–36.0)
MCV: 88.1 fL (ref 78.0–100.0)
Platelets: 210 10*3/uL (ref 150–400)
RBC: 4.38 MIL/uL (ref 3.87–5.11)
RDW: 12.7 % (ref 11.5–15.5)
WBC: 8.2 10*3/uL (ref 4.0–10.5)

## 2014-07-16 SURGERY — DILATION AND EVACUATION, UTERUS
Anesthesia: Monitor Anesthesia Care | Site: Vagina

## 2014-07-16 MED ORDER — PROPOFOL 10 MG/ML IV EMUL
INTRAVENOUS | Status: DC | PRN
  Administered 2014-07-16 (×4): 30 mg via INTRAVENOUS
  Administered 2014-07-16: 40 mg via INTRAVENOUS
  Administered 2014-07-16: 30 mg via INTRAVENOUS
  Administered 2014-07-16: 50 mg via INTRAVENOUS
  Administered 2014-07-16: 20 mg via INTRAVENOUS

## 2014-07-16 MED ORDER — LIDOCAINE HCL 2 % IJ SOLN
INTRAMUSCULAR | Status: AC
Start: 2014-07-16 — End: 2014-07-16
  Filled 2014-07-16: qty 20

## 2014-07-16 MED ORDER — FENTANYL CITRATE 0.05 MG/ML IJ SOLN
INTRAMUSCULAR | Status: DC | PRN
  Administered 2014-07-16 (×2): 50 ug via INTRAVENOUS

## 2014-07-16 MED ORDER — LIDOCAINE HCL 2 % IJ SOLN
INTRAMUSCULAR | Status: DC | PRN
  Administered 2014-07-16: 10 mL

## 2014-07-16 MED ORDER — PROPOFOL 10 MG/ML IV EMUL
INTRAVENOUS | Status: AC
  Filled 2014-07-16: qty 20

## 2014-07-16 MED ORDER — OXYCODONE-ACETAMINOPHEN 5-325 MG PO TABS: 1.0000 | ORAL_TABLET | Freq: Four times a day (QID) | ORAL | Status: DC | PRN

## 2014-07-16 MED ORDER — LIDOCAINE HCL (CARDIAC) 20 MG/ML IV SOLN
INTRAVENOUS | Status: AC
  Filled 2014-07-16: qty 5

## 2014-07-16 MED ORDER — MEPERIDINE HCL 25 MG/ML IJ SOLN: 6.2500 mg | INTRAMUSCULAR | Status: DC | PRN

## 2014-07-16 MED ORDER — KETOROLAC TROMETHAMINE 30 MG/ML IJ SOLN
INTRAMUSCULAR | Status: DC | PRN
  Administered 2014-07-16: 30 mg via INTRAVENOUS

## 2014-07-16 MED ORDER — SCOPOLAMINE 1 MG/3DAYS TD PT72
1.0000 | MEDICATED_PATCH | Freq: Once | TRANSDERMAL | Status: DC
  Administered 2014-07-16: 1.5 mg via TRANSDERMAL

## 2014-07-16 MED ORDER — KETOROLAC TROMETHAMINE 30 MG/ML IJ SOLN
INTRAMUSCULAR | Status: AC
  Filled 2014-07-16: qty 1

## 2014-07-16 MED ORDER — DEXAMETHASONE SODIUM PHOSPHATE 10 MG/ML IJ SOLN
INTRAMUSCULAR | Status: DC | PRN
  Administered 2014-07-16: 4 mg via INTRAVENOUS

## 2014-07-16 MED ORDER — ONDANSETRON HCL 4 MG/2ML IJ SOLN
INTRAMUSCULAR | Status: DC | PRN
  Administered 2014-07-16: 4 mg via INTRAVENOUS

## 2014-07-16 MED ORDER — METOCLOPRAMIDE HCL 5 MG/ML IJ SOLN: 10.0000 mg | Freq: Once | INTRAMUSCULAR | Status: DC | PRN

## 2014-07-16 MED ORDER — ONDANSETRON HCL 4 MG/2ML IJ SOLN
INTRAMUSCULAR | Status: AC
  Filled 2014-07-16: qty 2

## 2014-07-16 MED ORDER — MIDAZOLAM HCL 2 MG/2ML IJ SOLN
INTRAMUSCULAR | Status: AC
  Filled 2014-07-16: qty 2

## 2014-07-16 MED ORDER — LIDOCAINE HCL (CARDIAC) 20 MG/ML IV SOLN
INTRAVENOUS | Status: DC | PRN
  Administered 2014-07-16: 80 mg via INTRAVENOUS

## 2014-07-16 MED ORDER — LACTATED RINGERS IV SOLN
INTRAVENOUS | Status: DC
  Administered 2014-07-16: 13:00:00 via INTRAVENOUS

## 2014-07-16 MED ORDER — FENTANYL CITRATE 0.05 MG/ML IJ SOLN: 25.0000 ug | INTRAMUSCULAR | Status: DC | PRN

## 2014-07-16 MED ORDER — IBUPROFEN 600 MG PO TABS: 600.0000 mg | ORAL_TABLET | Freq: Four times a day (QID) | ORAL | Status: DC | PRN

## 2014-07-16 MED ORDER — MIDAZOLAM HCL 2 MG/2ML IJ SOLN
INTRAMUSCULAR | Status: DC | PRN
  Administered 2014-07-16: 2 mg via INTRAVENOUS

## 2014-07-16 MED ORDER — SCOPOLAMINE 1 MG/3DAYS TD PT72
MEDICATED_PATCH | TRANSDERMAL | Status: DC
Start: 2014-07-16 — End: 2014-07-16
  Filled 2014-07-16: qty 1

## 2014-07-16 MED ORDER — FENTANYL CITRATE 0.05 MG/ML IJ SOLN
INTRAMUSCULAR | Status: AC
  Filled 2014-07-16: qty 5

## 2014-07-16 SURGICAL SUPPLY — 18 items
CATH ROBINSON RED A/P 16FR (CATHETERS) ×3 IMPLANT
CLOTH BEACON ORANGE TIMEOUT ST (SAFETY) ×3 IMPLANT
DECANTER SPIKE VIAL GLASS SM (MISCELLANEOUS) ×3 IMPLANT
GLOVE BIO SURGEON STRL SZ7.5 (GLOVE) ×3 IMPLANT
GLOVE BIOGEL PI IND STRL 7.5 (GLOVE) ×2 IMPLANT
GLOVE BIOGEL PI INDICATOR 7.5 (GLOVE) ×4
GOWN STRL REUS W/TWL LRG LVL3 (GOWN DISPOSABLE) ×6 IMPLANT
KIT BERKELEY 1ST TRIMESTER 3/8 (MISCELLANEOUS) ×3 IMPLANT
NS IRRIG 1000ML POUR BTL (IV SOLUTION) ×3 IMPLANT
PACK VAGINAL MINOR WOMEN LF (CUSTOM PROCEDURE TRAY) ×3 IMPLANT
PAD OB MATERNITY 4.3X12.25 (PERSONAL CARE ITEMS) ×3 IMPLANT
PAD PREP 24X48 CUFFED NSTRL (MISCELLANEOUS) ×3 IMPLANT
SET BERKELEY SUCTION TUBING (SUCTIONS) ×3 IMPLANT
TOWEL OR 17X24 6PK STRL BLUE (TOWEL DISPOSABLE) ×6 IMPLANT
VACURETTE 10 RIGID CVD (CANNULA) ×3 IMPLANT
VACURETTE 7MM CVD STRL WRAP (CANNULA) IMPLANT
VACURETTE 8 RIGID CVD (CANNULA) IMPLANT
VACURETTE 9 RIGID CVD (CANNULA) IMPLANT

## 2014-07-16 NOTE — Anesthesia Postprocedure Evaluation (Signed)
  Anesthesia Post-op Note  Patient: Consulting civil engineer  Procedure(s) Performed: Procedure(s): DILATATION AND EVACUATION (N/A)  Patient Location: PACU  Anesthesia Type:MAC  Level of Consciousness: awake, alert  and oriented  Airway and Oxygen Therapy: Patient Spontanous Breathing  Post-op Pain: none  Post-op Assessment: Post-op Vital signs reviewed, Patient's Cardiovascular Status Stable, Respiratory Function Stable, Patent Airway, No signs of Nausea or vomiting and Pain level controlled  Post-op Vital Signs: Reviewed and stable   Last Vitals:  Filed Vitals:   07/16/14 1515  BP: 101/60  Pulse: 72  Temp:   Resp: 20    Complications: No apparent anesthesia complications

## 2014-07-16 NOTE — Anesthesia Preprocedure Evaluation (Addendum)
Anesthesia Evaluation  Patient identified by MRN, date of birth, ID band Patient awake    Reviewed: Allergy & Precautions, H&P , Patient's Chart, lab work & pertinent test results, reviewed documented beta blocker date and time   Airway Mallampati: II TM Distance: >3 FB Neck ROM: full    Dental no notable dental hx.    Pulmonary former smoker,  breath sounds clear to auscultation  Pulmonary exam normal       Cardiovascular negative cardio ROS  Rhythm:regular Rate:Normal     Neuro/Psych negative neurological ROS  negative psych ROS   GI/Hepatic negative GI ROS, Neg liver ROS,   Endo/Other  Obesity  Renal/GU negative Renal ROS  negative genitourinary   Musculoskeletal negative musculoskeletal ROS (+)   Abdominal (+) + obese,   Peds  Hematology negative hematology ROS (+)   Anesthesia Other Findings   Reproductive/Obstetrics (+) Pregnancy Missed Ab - 8 weeks                         Anesthesia Physical Anesthesia Plan  ASA: II  Anesthesia Plan: MAC   Post-op Pain Management:    Induction: Intravenous  Airway Management Planned: Mask and Natural Airway  Additional Equipment:   Intra-op Plan:   Post-operative Plan:   Informed Consent: I have reviewed the patients History and Physical, chart, labs and discussed the procedure including the risks, benefits and alternatives for the proposed anesthesia with the patient or authorized representative who has indicated his/her understanding and acceptance.   Dental Advisory Given  Plan Discussed with: CRNA, Surgeon and Anesthesiologist  Anesthesia Plan Comments: (Discussed sedation and potential to need to place airway or ETT if warranted by clinical changes intra-operatively. We will start procedure as MAC.)       Anesthesia Quick Evaluation

## 2014-07-16 NOTE — Op Note (Signed)
Preop Diagnosis: missed abortion   Postop Diagnosis: missed abortion   Procedure: DILATATION AND EVACUATION   Anesthesia: Choice   Anesthesiologist: Mal Amabile, MD   Attending: Purcell Nails, MD   Assistant: N/a  Findings: Large amount of POCs  Pathology: POCs  Fluids: 900 cc  UOP: 100 cc via straight cath  EBL: Minimal   Complications: None  Procedure: The patient was taken to the operating room after the risks benefits and alternatives were discussed with the patient, the patient verbalized understanding and consent signed and witnessed.  The patient was placed under MAC anesthesia, prepped and draped in the normal sterile fashion and a time out was performed.  A bivalve speculum was placed in the patient's vagina and the anterior lip of the cervix grasped with a single-tooth tenaculum. A paracervical block was administered using a total of 10 cc of 2% lidocaine.  The uterus was sounded to 12 cm and a size 10 suction curette was used. Suction curettage was performed until minimal tissue returned. Sharp curettage was performed until a gritty texture was noted. Suction curettage was performed once again to remove any remaining debris. All instruments were removed. The count was correct. The patient was transferred to the recovery room in good condition.

## 2014-07-16 NOTE — H&P (Deleted)
Elizabeth Chapman is a 28 y.o. female, [id number] presenting for D&C for IUFD.  Patient Active Problem List   Diagnosis Date Noted  . Body mass index (BMI) of 30.0-30.9 in adult 07/14/2014  . IUFD (intrauterine fetal death) 08/06/14  . Pregnancy complication, habitual aborter 08-06-2014  . History of prior pregnancy with SGA newborn August 06, 2014  Closely spaced pregnancies - last delivery 07/28/13  Pertinent Gynecological History: Menses: Irregular  Bleeding: None Contraception: none. Used Nuvaring from 09/08/2013 to 10/24/13 then switched to Micronor due to breastfeeding. +UPT on 06/28/14. Sexually transmitted diseases: no past history Previous GYN Procedures: Colpo at age 49  Last mammogram: NA Last pap: Normal Date: 12/07/12 - BV noted; not treated OB History: [id number], ? H/O fibroids   MEDICAL/FAMILY/SOCIAL HX: Patient's last menstrual period was 05/03/2014. LMP was unsure, yielding an EDD of 02/07/15; 10.1 wks.  Pt entered care at 8.0 wks.  Dating and viability scan completed on 08/06/14 = No cardiac activity seen, fetus measured [redacted]w[redacted]d.     Past Medical History  Diagnosis Date  . Medical history non-contributory   . Abnormal Pap smear AGE 58    COLPO; LAST PAP 2011  . Infection     UTI X 1  . Infection 2007    X 1  FOB African American w/ h/o +SCT  Past Surgical History  Procedure Laterality Date  . Superficial lymph node biopsy / excision    . Appendectomy    . Tonsillectomy    . Appendectomy  AGE 43  . Lymph nodes removed from neck  AGE 43  . Tonsillectomy  AGE 47    Family History  Problem Relation Age of Onset  . Early death Father     WORK ACCIDENT  . Learning disabilities Sister   . Epilepsy Brother     DUE TO CAR ACCIDENT   OB HX: SVD 07/28/13 ? SAB x 2  Last evaluation: @ 8 wks on 06/28/14 for NOB w/u. Normal cervix, normal BP, +UPT.   Social History:  reports that she quit smoking about 7 years ago. Her smoking use included Cigarettes. She smoked 0.30 packs  per day. She has never used smokeless tobacco. She reports that she drinks alcohol. She reports that she does not use illicit drugs.Reports exposure to passive cigarette smoke. She is a Caucasian female who is in the [employer]. She is married to [place] who is present and supportive.   ALLERGIES/MEDS:  Allergies: No Known Allergies  No prescriptions prior to admission     ROS - VB - Pain  Unsure LMP on 05/03/14. Pt. Currently breastfeeding her 36+ month old daughter.   Physical Exam Gen: Tearful Lungs: CTA Bilat CV: RRR, no murmur, gallop, click or rub Abdomen: Gravid, soft, NTND Pelvic: Deferred Ext: 2+DTRs, no clonus, no edema  U/S at [facility] as follows:   US Ob Comp Less 14 Wks  August 06, 2014   CLINICAL DATA:  Concern for intrauterine fetal demise  EXAM: OBSTETRIC <14 WK [place] AND TRANSVAGINAL OB US  TECHNIQUE: Both transabdominal and transvaginal ultrasound examinations were performed for complete evaluation of the gestation as well as the maternal uterus, adnexal regions, and pelvic cul-de-sac. Transvaginal technique was performed to assess early pregnancy.  COMPARISON:  None.  FINDINGS: Intrauterine gestational sac: Visualized/normal in shape.  Yolk sac:  Visualize  Embryo:  Visualized  Cardiac Activity: Not visualized  CRL:   19  mm   8 w 4 d  Maternal uterus/adnexae: There is no demonstrable subchorionic hemorrhage. Cervical os  is closed. No intrauterine mass. Both maternal ovaries appear normal. No extrauterine pelvic mass or free fluid.  IMPRESSION: Findings meet definitive criteria for failed pregnancy. This follows SRU consensus guidelines: Diagnostic Criteria for Nonviable Pregnancy Early in the First Trimester. [facility] Mis J Med [phone]. No cardiac motion identified.  These results will be called to the ordering clinician or representative by the Radiologist Assistant, and communication documented in the PACS or zVision Dashboard.   Electronically Signed   By: Bretta Bang M.D.    On: 07/13/2014 14:13   US Ob Transvaginal  07/13/2014   CLINICAL DATA:  Concern for intrauterine fetal demise  EXAM: OBSTETRIC <14 WK [place] AND TRANSVAGINAL OB US  TECHNIQUE: Both transabdominal and transvaginal ultrasound examinations were performed for complete evaluation of the gestation as well as the maternal uterus, adnexal regions, and pelvic cul-de-sac. Transvaginal technique was performed to assess early pregnancy.  COMPARISON:  None.  FINDINGS: Intrauterine gestational sac: Visualized/normal in shape.  Yolk sac:  Visualize  Embryo:  Visualized  Cardiac Activity: Not visualized  CRL:   19  mm   8 w 4 d  Maternal uterus/adnexae: There is no demonstrable subchorionic hemorrhage. Cervical os is closed. No intrauterine mass. Both maternal ovaries appear normal. No extrauterine pelvic mass or free fluid.  IMPRESSION: Findings meet definitive criteria for failed pregnancy. This follows SRU consensus guidelines: Diagnostic Criteria for Nonviable Pregnancy Early in the First Trimester. [facility]acy Mis J Med [phone]. No cardiac motion identified.  These results will be called to the ordering clinician or representative by the Radiologist Assistant, and communication documented in the PACS or zVision Dashboard.   Electronically Signed   By: Bretta Bang M.D.   On: 07/13/2014 14:13      Prenatal Labs: ABO: O+ (06/28/14)  ABS: Neg (07/28/13 @ 1555) RPR: NR (07/28/13 @ 1555) NOB hemoglobin 13.3 (06/28/14) Other: urine culture neg on 06/28/14 GC/CT: Neg on 06/28/14   ASSESSMENT: Missed AB at 8.2 wks, 10.1 wks by unsure LMP Asymptomatic Elevated BMI (30.1)  PLAN: Reviewed both ultrasounds and options for management in detail with patient and spouse. Pt desired expectant management initially and called approximately 1 hour after discussion and stated she wanted a D&C ASAP. Shared pt's desire w/ Dr. Estanislado Pandy who scheduled pt for surgery on Monday at 2:30 pm. Pt informed to arrive to MAU at 1 pm and advise MAU  staff of her scheduled surgery. Pt informed of NPO after midnight. Strict bleeding precautions given. Pt. Verbalized understanding.    Sherre Scarlet CNM 07/16/2014, 08:08 AM

## 2014-07-16 NOTE — Transfer of Care (Signed)
Immediate Anesthesia Transfer of Care Note  Patient: Elizabeth Chapman  Procedure(s) Performed: Procedure(s): DILATATION AND EVACUATION (N/A)  Patient Location: PACU  Anesthesia Type:MAC  Level of Consciousness: awake, alert  and oriented  Airway & Oxygen Therapy: Patient Spontanous Breathing and Patient connected to nasal cannula oxygen  Post-op Assessment: Report given to PACU RN, Post -op Vital signs reviewed and stable and Patient moving all extremities  Post vital signs: Reviewed and stable  Complications: No apparent anesthesia complications

## 2014-07-16 NOTE — Discharge Instructions (Signed)
DISCHARGE INSTRUCTIONS: D&E The following instructions have been prepared to help you care for yourself upon your return home.  MAY TAKE IBUPROFEN (MOTRIN, ADVIL) OR ALEVE AFTER 7:30 PM FOR CRAMPS!!!   Personal hygiene:  Use sanitary pads for vaginal drainage, not tampons.  Shower the day after your procedure.  NO tub baths, pools or Jacuzzis for 2-3 weeks.  Wipe front to back after using the bathroom.  Activity and limitations:  Do NOT drive or operate any equipment for 24 hours. The effects of anesthesia are still present and drowsiness may result.  Do NOT rest in bed all day.  Walking is encouraged.  Walk up and down stairs slowly.  You may resume your normal activity in one to two days or as indicated by your physician.  Sexual activity: NO intercourse for at least 2 weeks after the procedure, or as indicated by your physician.  Diet: Eat a light meal as desired this evening. You may resume your usual diet tomorrow.  Return to work: You may resume your work activities in one to two days or as indicated by your doctor.  What to expect after your surgery: Expect to have vaginal bleeding/discharge for 2-3 days and spotting for up to 10 days. It is not unusual to have soreness for up to 1-2 weeks. You may have a slight burning sensation when you urinate for the first day. Mild cramps may continue for a couple of days. You may have a regular period in 2-6 weeks.  Call your doctor for any of the following:  Excessive vaginal bleeding, saturating and changing one pad every hour.  Inability to urinate 6 hours after discharge from hospital.  Pain not relieved by pain medication.  Fever of 100.4 F or greater.  Unusual vaginal discharge or odor.   Call for an appointment:    Patients signature: ______________________  Nurses signature ________________________  Support person's signature_______________________

## 2014-07-17 ENCOUNTER — Encounter (HOSPITAL_COMMUNITY): Payer: Self-pay | Admitting: Obstetrics and Gynecology

## 2014-07-18 NOTE — MAU Provider Note (Signed)
History  Elizabeth Chapman is a 28 y.o. female, [id number] @ 10.1 wks by uncertain LMP who presented to MAU to discuss u/s findings and review options.   Had 2nd dating/viability scan just prior to counseling that confirmed IUFD. Patient offered no complaints. Denies vaginal bleeding and abdominal pain.   Office u/s showed retroverted uterus, SIUP @ 109w2d; no cardiac activity with normal ovaries and adnexas.   Patient Active Problem List   Diagnosis Date Noted  . Body mass index (BMI) of 30.0-30.9 in adult 07/14/2014  . IUFD (intrauterine fetal death) 07-17-14  . Pregnancy complication, habitual aborter July 17, 2014  . History of prior pregnancy with SGA newborn 07/17/14  Closely spaced pregnancies  No chief complaint on file.  HPI See above OB History   Grav Para Term Preterm Abortions TAB SAB Ect Mult Living   Past Medical History  Diagnosis Date  . Medical history non-contributory   . Abnormal Pap smear AGE 30    COLPO; LAST PAP 2011  . Infection     UTI X 1  . Infection 2007    X 1    Past Surgical History  Procedure Laterality Date  . Superficial lymph node biopsy / excision    . Appendectomy    . Tonsillectomy    . Appendectomy  AGE 19  . Lymph nodes removed from neck  AGE 19  . Tonsillectomy  AGE 2  . Dilation and evacuation N/A 07/16/2014    Procedure: DILATATION AND EVACUATION;  Surgeon: Purcell Nails, MD;  Location: [facility];  Service: Gynecology;  Laterality: N/A;    Family History  Problem Relation Age of Onset  . Early death Father     WORK ACCIDENT  . Learning disabilities Sister   . Epilepsy Brother     DUE TO CAR ACCIDENT    History  Substance Use Topics  . Smoking status: Former Smoker -- 0.30 packs/day    Types: Cigarettes    Quit date: 10/26/2006  . Smokeless tobacco: Never Used  . Alcohol Use: Yes     Comment: OCC    Allergies: No Known Allergies  No prescriptions prior to admission    ROS No pain No  VB  Physical Exam  CLINICAL DATA: Concern for intrauterine fetal demise  EXAM:  OBSTETRIC <14 WK [place] AND TRANSVAGINAL OB US  TECHNIQUE:  Both transabdominal and transvaginal ultrasound examinations were  performed for complete evaluation of the gestation as well as the  maternal uterus, adnexal regions, and pelvic cul-de-sac.  Transvaginal technique was performed to assess early pregnancy.  COMPARISON: None.  FINDINGS:  Intrauterine gestational sac: Visualized/normal in shape.  Yolk sac: Visualize  Embryo: Visualized  Cardiac Activity: Not visualized  CRL: 19 mm 8 w 4 d  Maternal uterus/adnexae: There is no demonstrable subchorionic  hemorrhage. Cervical os is closed. No intrauterine mass. Both  maternal ovaries appear normal. No extrauterine pelvic mass or free  fluid.  IMPRESSION:  Findings meet definitive criteria for failed pregnancy. This follows  SRU consensus guidelines: Diagnostic Criteria for Nonviable  Pregnancy Early in the First Trimester. [facility]  [phone]. No cardiac motion identified.  These results will be called to the ordering clinician or  representative by the Radiologist Assistant, and communication  documented in the PACS or zVision Dashboard.  Electronically Signed  By: Bretta Bang M.D.  On: 07-17-2014 14:13    Blood pressure 101/60, pulse  72, temperature 98 F (36.7 C), temperature source Oral, resp. rate 20, last menstrual period 05/03/2014, SpO2 100.00%, unknown if currently breastfeeding.  Physical Exam Gen: Tearful ED Course   Assessment: Missed AB at 8.2 wks  Asymptomatic  Elevated BMI (30.1)   Plan: Reviewed both ultrasounds and options for management in detail with patient and spouse. Pt desired expectant management initially and called approximately 1 hour after discussion and stated she wanted a D&C ASAP. Shared pt's desire w/ Dr. Estanislado Pandy who scheduled pt for surgery on Monday at 2:30 pm. Pt informed to arrive to MAU  at 1 pm and advise MAU staff of her scheduled surgery. Pt informed of NPO after midnight. Strict bleeding precautions given. Pt. Verbalized understanding.     Sherre Scarlet CNM, MS 07/13/14 @ 3:14 PM

## 2014-08-27 ENCOUNTER — Encounter (HOSPITAL_COMMUNITY): Payer: Self-pay | Admitting: Obstetrics and Gynecology

## 2014-12-21 LAB — OB RESULTS CONSOLE ABO/RH: RH Type: POSITIVE

## 2014-12-21 LAB — OB RESULTS CONSOLE ANTIBODY SCREEN: Antibody Screen: NEGATIVE

## 2014-12-21 LAB — OB RESULTS CONSOLE GC/CHLAMYDIA
Chlamydia: NEGATIVE
Chlamydia: NEGATIVE
GC PROBE AMP, GENITAL: NEGATIVE
GC PROBE AMP, GENITAL: NEGATIVE

## 2014-12-21 LAB — OB RESULTS CONSOLE RUBELLA ANTIBODY, IGM: RUBELLA: IMMUNE

## 2014-12-21 LAB — OB RESULTS CONSOLE HIV ANTIBODY (ROUTINE TESTING)
HIV: NONREACTIVE
HIV: NONREACTIVE

## 2014-12-21 LAB — OB RESULTS CONSOLE RPR
RPR: NONREACTIVE
RPR: NONREACTIVE

## 2014-12-21 LAB — OB RESULTS CONSOLE HEPATITIS B SURFACE ANTIGEN: HEP B S AG: NEGATIVE

## 2015-05-18 ENCOUNTER — Encounter (HOSPITAL_COMMUNITY): Payer: Self-pay | Admitting: *Deleted

## 2015-06-05 ENCOUNTER — Encounter (HOSPITAL_COMMUNITY): Payer: Self-pay | Admitting: *Deleted

## 2015-06-05 ENCOUNTER — Telehealth (HOSPITAL_COMMUNITY): Payer: Self-pay | Admitting: *Deleted

## 2015-06-05 NOTE — Telephone Encounter (Signed)
Preadmission screen  

## 2015-06-06 ENCOUNTER — Encounter (HOSPITAL_COMMUNITY): Payer: Self-pay

## 2015-06-06 ENCOUNTER — Observation Stay (HOSPITAL_COMMUNITY)
Admission: RE | Admit: 2015-06-06 | Discharge: 2015-06-06 | Disposition: A | Discharge status: Home / Self Care | Payer: BLUE CROSS/BLUE SHIELD | Source: Ambulatory Visit | Attending: Obstetrics and Gynecology | Admitting: Obstetrics and Gynecology

## 2015-06-06 DIAGNOSIS — O3663X0 Maternal care for excessive fetal growth, third trimester, not applicable or unspecified: Secondary | ICD-10-CM | POA: Diagnosis not present

## 2015-06-06 DIAGNOSIS — E669 Obesity, unspecified: Secondary | ICD-10-CM | POA: Diagnosis not present

## 2015-06-06 DIAGNOSIS — Z87440 Personal history of urinary (tract) infections: Secondary | ICD-10-CM | POA: Insufficient documentation

## 2015-06-06 DIAGNOSIS — Z6837 Body mass index (BMI) 37.0-37.9, adult: Secondary | ICD-10-CM | POA: Insufficient documentation

## 2015-06-06 DIAGNOSIS — O99213 Obesity complicating pregnancy, third trimester: Secondary | ICD-10-CM | POA: Insufficient documentation

## 2015-06-06 DIAGNOSIS — Z3A37 37 weeks gestation of pregnancy: Secondary | ICD-10-CM | POA: Insufficient documentation

## 2015-06-06 DIAGNOSIS — Z87891 Personal history of nicotine dependence: Secondary | ICD-10-CM | POA: Diagnosis not present

## 2015-06-06 DIAGNOSIS — O321XX0 Maternal care for breech presentation, not applicable or unspecified: Principal | ICD-10-CM

## 2015-06-06 MED ORDER — TERBUTALINE SULFATE 1 MG/ML IJ SOLN
INTRAMUSCULAR | Status: AC
Start: 2015-06-06 — End: 2015-06-06
  Administered 2015-06-06: 0.25 mg via SUBCUTANEOUS
  Filled 2015-06-06: qty 1

## 2015-06-06 MED ORDER — SODIUM CHLORIDE 0.9 % IJ SOLN: 3.0000 mL | INTRAMUSCULAR | Status: DC | PRN

## 2015-06-06 MED ORDER — SODIUM CHLORIDE 0.9 % IV SOLN: 250.0000 mL | INTRAVENOUS | Status: DC | PRN

## 2015-06-06 MED ORDER — TERBUTALINE SULFATE 1 MG/ML IJ SOLN
0.2500 mg | Freq: Once | INTRAMUSCULAR | Status: AC
  Administered 2015-06-06: 0.25 mg via SUBCUTANEOUS

## 2015-06-06 MED ORDER — SODIUM CHLORIDE 0.9 % IJ SOLN: 3.0000 mL | Freq: Two times a day (BID) | INTRAMUSCULAR | Status: DC

## 2015-06-06 NOTE — Discharge Summary (Signed)
Discharge Summary for [facility] Antenatal Obstetrical Patient  Elizabeth Chapman  DOB:    05-22-1986 MRN:    [id number] CSN:    [id number]  Date of admission:                    06/06/2015 Date of discharge:                     06/06/2015  Admission Diagnosis:  37 week and 1 day gestation  Breech presentation  Large for gestational age infant   Discharge Diagnosis:  Same  Unsuccessful external version  Procedures this admission:  06/06/2015  Unsuccessful external version  Limited ultrasound in the third trimester  Interpretation on the nonstress test  History of Present Illness:  Ms. Paighton Godette is a 29 y.o. female, [id number], who presents at [redacted]w[redacted]d weeks gestation. The patient has been followed at the [facility] Gynecology division of [facility]. She was admitted External version. Her pregnancy has been complicated by: Breech presentation. She has a large for gestational age infant on ultrasound.  Hospital course:  The patient was admitted for external version.   The external version was unsuccessful. The baby was monitored and fetal reassurance was documented. She was discharged to home doing well.  Discharge Physical Exam:  Ht  (1.6 m)  Wt 214 lb (97.07 kg)  BMI 37.92 kg/m2  LMP 09/19/2014   Nontender abdomen.  Discharge Information:  Activity:           unrestricted Diet:                routine Medications: PNV and Tylenol as needed Condition:      stable Instructions:  Call for bleeding or problems Discharge to: home  Follow-up Information    Follow up with [facility] In 1 week.   Contact information:   [address] [place] [address] [phone]      Follow up with Janine Limbo, MD. Schedule an appointment as soon as possible for a visit in 1 week.   Specialty:  Obstetrics and Gynecology   Contact information:   [address] [place] [address] [phone]      Cesarean section will be scheduled at [redacted] weeks gestation.  Janine Limbo 06/06/2015

## 2015-06-06 NOTE — Anesthesia Preprocedure Evaluation (Addendum)
Anesthesia Evaluation  Patient identified by MRN, date of birth, ID band Patient awake    Reviewed: Allergy & Precautions, H&P , NPO status , Patient's Chart, lab work & pertinent test results  Airway Mallampati: III  TM Distance: >3 FB Neck ROM: Full    Dental no notable dental hx. (+) Teeth Intact   Pulmonary former smoker,  breath sounds clear to auscultation  Pulmonary exam normal       Cardiovascular Exercise Tolerance: Good negative cardio ROS Normal cardiovascular examRhythm:Regular Rate:Normal     Neuro/Psych negative neurological ROS  negative psych ROS   GI/Hepatic Neg liver ROS, GERD-  ,  Endo/Other  Obesity  Renal/GU negative Renal ROS  negative genitourinary   Musculoskeletal negative musculoskeletal ROS (+)   Abdominal (+) + obese,   Peds  Hematology negative hematology ROS (+)   Anesthesia Other Findings   Reproductive/Obstetrics (+) Pregnancy G2P1 Breech presentation 37 weeks AOG                            Anesthesia Physical Anesthesia Plan  ASA: III  Anesthesia Plan: Spinal   Post-op Pain Management:    Induction:   Airway Management Planned: Natural Airway  Additional Equipment:   Intra-op Plan:   Post-operative Plan:   Informed Consent: I have reviewed the patients History and Physical, chart, labs and discussed the procedure including the risks, benefits and alternatives for the proposed anesthesia with the patient or authorized representative who has indicated his/her understanding and acceptance.   Dental advisory given  Plan Discussed with: Anesthesiologist  Anesthesia Plan Comments: (Patient for attempted version today. Discussed C/Section anesthesia with patient. Risks, benefits and alternatives discussed. Questions answered.)       Anesthesia Quick Evaluation

## 2015-06-06 NOTE — Discharge Instructions (Signed)
Cesarean Delivery  Cesarean delivery is the birth of a baby through a cut (incision) in the abdomen and womb (uterus).  LET [facility] CARE PROVIDER KNOW ABOUT:  All medicines you are taking, including vitamins, herbs, eye drops, creams, and over-the-counter medicines.  Previous problems you or members of your family have had with the use of anesthetics.  Any blood disorders you have.  Previous surgeries you have had.  Medical conditions you have.  Any allergies you have.  Complicationsinvolving the pregnancy. RISKS AND COMPLICATIONS  Generally, this is a safe procedure. However, as with any procedure, complications can occur. Possible complications include:  Bleeding.  Infection.  Blood clots.  Injury to surrounding organs.  Problems with anesthesia.  Injury to the baby. BEFORE THE PROCEDURE   You may be given an antacid medicine to drink. This will prevent acid contents in your stomach from going into your lungs if you vomit during the surgery.  You may be given an antibiotic medicine to prevent infection. PROCEDURE   Hair may be removed from your pubic area and your lower abdomen. This is to prevent infection in the incision site.  A tube (Foley catheter) will be placed in your bladder to drain your urine from your bladder into a bag. This keeps your bladder empty during surgery.  An IV tube will be placed in your vein.  You may be given medicine to numb the lower half of your body (regional anesthetic). If you were in labor, you may have already had an epidural in place which can be used in both labor and cesarean delivery. You may possibly be given medicine to make you sleep (general anesthetic) though this is not as common.  An incision will be made in your abdomen that extends to your uterus. There are 2 basic kinds of incisions:  The horizontal (transverse) incision. Horizontal incisions are from side to side and are used for most routine cesarean  deliveries.  The vertical incision. The vertical incision is from the top of the abdomen to the bottom and is less commonly used. It is often done for women who have a serious complication (extreme prematurity) or under emergency situations.  The horizontal and vertical incisions may both be used at the same time. However, this is very uncommon.  An incision is then made in your uterus to deliver the baby.  Your baby will then be delivered.  Both incisions are then closed with absorbable stitches. AFTER THE PROCEDURE   If you were awake during the surgery, you will see your baby right away. If you were asleep, you will see your baby as soon as you are awake.  You may breastfeed your baby after surgery.  You may be able to get up and walk the same day as the surgery. If you need to stay in bed for a period of time, you will receive help to turn, cough, and take deep breaths after surgery. This helps prevent lung problems such as pneumonia.  Do not get out of bed alone the first time after surgery. You will need help getting out of bed until you are able to do this by yourself.  You may be able to shower the day after your cesarean delivery. After the bandage (dressing) is taken off the incision site, a nurse will assist you to shower if you would like help.  You will have pneumatic compression hose placed on your lower legs. This is done to prevent blood clots. When you are up  and walking regularly, they will no longer be necessary.  Do not cross your legs when you sit.  Save any blood clots that you pass. If you pass a clot while on the toilet, do not flush it. Call for the nurse. Tell the nurse if you think you are bleeding too much or passing too many clots.  You will be given medicine as needed. Let your health care providers know if you are hurting. You may also be given an antibiotic to prevent an infection.  Your IV tube will be taken out when you are drinking a reasonable  amount of fluids. The Foley catheter is taken out when you are up and walking.  If your blood type is Rh negative and your baby's blood type is Rh positive, you will be given a shot of anti-D immune globulin. This shot prevents you from having Rh problems with a future pregnancy. You should get the shot even if you had your tubes tied (tubal ligation).  If you are allowed to take the baby for a walk, place the baby in the bassinet and push it. Do not carry your baby in your arms. Document Released: 10/12/2005 Document Revised: 08/02/2013 Document Reviewed: 05/03/2013 [facility] Patient Information 2015 [place], [place]. This information is not intended to replace advice given to you by your health care provider. Make sure you discuss any questions you have with your health care provider. Fetal Movement Counts Patient Name: __________________________________________________ Patient Due Date: ____________________ Performing a fetal movement count is highly recommended in high-risk pregnancies, but it is good for every pregnant woman to do. Your health care provider may ask you to start counting fetal movements at 28 weeks of the pregnancy. Fetal movements often increase:  After eating a full meal.  After physical activity.  After eating or drinking something sweet or cold.  At rest. Pay attention to when you feel the baby is most active. This will help you notice a pattern of your baby's sleep and wake cycles and what factors contribute to an increase in fetal movement. It is important to perform a fetal movement count at the same time each day when your baby is normally most active.  HOW TO COUNT FETAL MOVEMENTS  Find a quiet and comfortable area to sit or lie down on your left side. Lying on your left side provides the best blood and oxygen circulation to your baby.  Write down the day and time on a sheet of paper or in a journal.  Start counting kicks, flutters, swishes, rolls, or jabs in a  2-hour period. You should feel at least 10 movements within 2 hours.  If you do not feel 10 movements in 2 hours, wait 2-3 hours and count again. Look for a change in the pattern or not enough counts in 2 hours. SEEK MEDICAL CARE IF:  You feel less than 10 counts in 2 hours, tried twice.  There is no movement in over an hour.  The pattern is changing or taking longer each day to reach 10 counts in 2 hours.  You feel the baby is not moving as he or she usually does. Date: ____________ Movements: ____________ Start time: ____________ Doreatha Martin time: ____________  Date: ____________ Movements: ____________ Start time: ____________ Doreatha Martin time: ____________ Date: ____________ Movements: ____________ Start time: ____________ Doreatha Martin time: ____________ Date: ____________ Movements: ____________ Start time: ____________ Doreatha Martin time: ____________ Date: ____________ Movements: ____________ Start time: ____________ Doreatha Martin time: ____________ Date: ____________ Movements: ____________ Start time: ____________ Doreatha Martin time: ____________  Date: ____________ Movements: ____________ Start time: ____________ Doreatha Martin time: ____________ Date: ____________ Movements: ____________ Start time: ____________ Doreatha Martin time: ____________  Date: ____________ Movements: ____________ Start time: ____________ Doreatha Martin time: ____________ Date: ____________ Movements: ____________ Start time: ____________ Doreatha Martin time: ____________ Date: ____________ Movements: ____________ Start time: ____________ Doreatha Martin time: ____________ Date: ____________ Movements: ____________ Start time: ____________ Doreatha Martin time: ____________ Date: ____________ Movements: ____________ Start time: ____________ Doreatha Martin time: ____________ Date: ____________ Movements: ____________ Start time: ____________ Doreatha Martin time: ____________ Date: ____________ Movements: ____________ Start time: ____________ Doreatha Martin time: ____________  Date: ____________ Movements:  ____________ Start time: ____________ Doreatha Martin time: ____________ Date: ____________ Movements: ____________ Start time: ____________ Doreatha Martin time: ____________ Date: ____________ Movements: ____________ Start time: ____________ Doreatha Martin time: ____________ Date: ____________ Movements: ____________ Start time: ____________ Doreatha Martin time: ____________ Date: ____________ Movements: ____________ Start time: ____________ Doreatha Martin time: ____________ Date: ____________ Movements: ____________ Start time: ____________ Doreatha Martin time: ____________ Date: ____________ Movements: ____________ Start time: ____________ Doreatha Martin time: ____________  Date: ____________ Movements: ____________ Start time: ____________ Doreatha Martin time: ____________ Date: ____________ Movements: ____________ Start time: ____________ Doreatha Martin time: ____________ Date: ____________ Movements: ____________ Start time: ____________ Doreatha Martin time: ____________ Date: ____________ Movements: ____________ Start time: ____________ Doreatha Martin time: ____________ Date: ____________ Movements: ____________ Start time: ____________ Doreatha Martin time: ____________ Date: ____________ Movements: ____________ Start time: ____________ Doreatha Martin time: ____________ Date: ____________ Movements: ____________ Start time: ____________ Doreatha Martin time: ____________  Date: ____________ Movements: ____________ Start time: ____________ Doreatha Martin time: ____________ Date: ____________ Movements: ____________ Start time: ____________ Doreatha Martin time: ____________ Date: ____________ Movements: ____________ Start time: ____________ Doreatha Martin time: ____________ Date: ____________ Movements: ____________ Start time: ____________ Doreatha Martin time: ____________ Date: ____________ Movements: ____________ Start time: ____________ Doreatha Martin time: ____________ Date: ____________ Movements: ____________ Start time: ____________ Doreatha Martin time: ____________ Date: ____________ Movements: ____________ Start time: ____________ Doreatha Martin  time: ____________  Date: ____________ Movements: ____________ Start time: ____________ Doreatha Martin time: ____________ Date: ____________ Movements: ____________ Start time: ____________ Doreatha Martin time: ____________ Date: ____________ Movements: ____________ Start time: ____________ Doreatha Martin time: ____________ Date: ____________ Movements: ____________ Start time: ____________ Doreatha Martin time: ____________ Date: ____________ Movements: ____________ Start time: ____________ Doreatha Martin time: ____________ Date: ____________ Movements: ____________ Start time: ____________ Doreatha Martin time: ____________ Date: ____________ Movements: ____________ Start time: ____________ Doreatha Martin time: ____________  Date: ____________ Movements: ____________ Start time: ____________ Doreatha Martin time: ____________ Date: ____________ Movements: ____________ Start time: ____________ Doreatha Martin time: ____________ Date: ____________ Movements: ____________ Start time: ____________ Doreatha Martin time: ____________ Date: ____________ Movements: ____________ Start time: ____________ Doreatha Martin time: ____________ Date: ____________ Movements: ____________ Start time: ____________ Doreatha Martin time: ____________ Date: ____________ Movements: ____________ Start time: ____________ Doreatha Martin time: ____________ Date: ____________ Movements: ____________ Start time: ____________ Doreatha Martin time: ____________  Date: ____________ Movements: ____________ Start time: ____________ Doreatha Martin time: ____________ Date: ____________ Movements: ____________ Start time: ____________ Doreatha Martin time: ____________ Date: ____________ Movements: ____________ Start time: ____________ Doreatha Martin time: ____________ Date: ____________ Movements: ____________ Start time: ____________ Doreatha Martin time: ____________ Date: ____________ Movements: ____________ Start time: ____________ Doreatha Martin time: ____________ Date: ____________ Movements: ____________ Start time: ____________ Doreatha Martin time: ____________ Document Released:  11/11/2006 Document Revised: 02/26/2014 Document Reviewed: 08/08/2012 [employer] Patient Information 2015 [place] [employer]. This information is not intended to replace advice given to you by your health care provider. Make sure you discuss any questions you have with your health care provider. External Cephalic Version External cephalic version is turning a baby that is presenting his or her buttocks first (breech) or is lying sideways in the uterus (transverse) to a head-first position. This makes the labor and delivery faster, safer for the mother and baby, and lessens  the chance for a cesarean section. It should not be tried until the pregnancy is [redacted] weeks along or longer. BEFORE THE PROCEDURE   Do not take aspirin.  Do not eat for 4 hours before the procedure.  Tell your caregiver if you have a cold, fever, or an infection.  Tell your caregiver if you are having contractions.  Tell your caregiver if you are leaking or had a gush of fluid from your vagina.  Tell your caregiver if you have any vaginal bleeding or abnormal discharge.  If you are being admitted the same day, arrive at the hospital at least one hour before the procedure to sign any necessary documents and to get prepared for the procedure.  Tell your caregiver if you had any problems with anesthetics in the past.  Tell your caregiver if you are taking any medications that your caregiver does not know about. This includes over-the-counter and prescription drugs, herbs, eye drops and creams. PROCEDURE  First, an ultrasound is done to make sure the baby is breech or transverse.  A non-stress test or biophysical profile is done on the baby before the ECV. This is done to make sure it is safe for the baby to have the ECV. It may also be done after the procedure to make sure the baby is okay.  ECV is done in the delivery/surgical room with an anesthesiologist present. There should be a setup for an emergency cesarean section with  a full nursing and nursery staff available and ready.  The patient may be given a medication to relax the uterine muscles. An epidural may be given for any discomfort. It is helpful for the success of the ECV.  An electronic fetal monitor is placed on the uterus during the procedure to make sure the baby is okay.  If the mother is Rh-negative, Rho (D) immune globulin will be given to her to prevent Rh problems for future pregnancies.  The mother is followed closely for 2 to 3 hours after the procedure to make sure no problems develop. BENEFITS OF ECV  Easier and safer labor and delivery for the mother and baby.  Lower incidence of cesarean section.  Lower costs with a vaginal delivery. RISKS OF ECV  The placenta pulls away from the wall of the uterus before delivery (abruption of the placenta).  Rupture of the uterus, especially in patients with a previous cesarean section.  Fetal distress.  Early (premature) labor.  Premature rupture of the membranes.  The baby will return to the breech or transverse lie position.  Death of the fetus can happen but is very rare. ECV SHOULD BE STOPPED IF:  The fetal heart tones drop.  The mother is having a lot of pain.  You cannot turn the baby after several attempts. ECV SHOULD NOT BE DONE IF:  The non-stress test or biophysical profile is abnormal.  There is vaginal bleeding.  An abnormal shaped uterus is present.  There is heart disease or uncontrolled high blood pressure in the mother.  There are twins or more.  The placenta covers the opening of the cervix (placenta previa).  You had a previous cesarean section with a classical incision or major surgery of the uterus.  There is not enough amniotic fluid in the sac (oligohydramnios).  The baby is too small for the pregnancy or has not developed normally (anomaly).  Your membranes have ruptured. HOME CARE INSTRUCTIONS   Have someone take you home after the  procedure.  Rest at  home for several hours.  Have someone stay with you for a few hours after you get home.  After ECV, continue with your prenatal visits as directed.  Continue your regular diet, rest and activities.  Do not do any strenuous activities for a couple of days. SEEK IMMEDIATE MEDICAL CARE IF:   You develop vaginal bleeding.  You have fluid coming out of your vagina (bag of water may have broken).  You develop uterine contractions.  You do not feel the baby move or there is less movement of the baby.  You develop abdominal pain.  You develop an oral temperature of 102 F (38.9 C) or higher. Document Released: 04/06/2007 Document Revised: 02/26/2014 Document Reviewed: 01/30/2009 [facility] Patient Information 2015 [place], [place]. This information is not intended to replace advice given to you by your health care provider. Make sure you discuss any questions you have with your health care provider.

## 2015-06-06 NOTE — Op Note (Signed)
OPERATIVE NOTE  Elizabeth Chapman  DOB:    November 27, 1985  MRN:    [id number]  CSN:    [id number]  Date of Surgery:  06/06/2015  Preoperative Diagnosis:  [redacted]w[redacted]d  Breech Presentation  Macrosomia  Obesity  Postoperative Diagnosis:  [redacted]w[redacted]d  Breech Presentation  Macrosomia  Obesity  Procedure:  Unsuccessful External Version  Surgeon:  Leonard Schwartz, M.D.  Assistant:  Firefighter, CNM  Anesthetic:  None  Disposition:   The patient presents with an infant with a breech presentation documented by ultrasound. She denies bleeding and leakage of fluid. The treatment options were reviewed including observation, external version, and cesarean delivery. The risk and benefits were discussed. Questions were answered. The patient wants to proceed with an attempt at external version. She understands that fetal stress is a possibility, and that we may need to proceed today with cesarean delivery. She understands the indications for the procedure. She accepts the risk of, but not limited to, anesthetic complications, bleeding, infections, and possible damage to the surrounding organs.  Findings:  An ultrasound was performed and a breech presentation was confirmed. The amniotic fluid volume was normal. Normal fetal heart motion was documented. There was no evidence of an abruption. The fetal head is located in the mid upper quadrant. The patient's blood type is O+.  Procedure:  The patient was evaluated on the labor and delivery suite. The fetal heart rate tracing is a category 1. The procedure was carefully explained to the patient and her family members. The risk and benefits were reviewed. A permit was signed. The patient had a saline lock started. The patient was given 0.25 mg of subcutaneous terbutaline. An ultrasound was performed with findings as mentioned above. The patient's abdomen was covered with ultrasound gel. Four attempts were made to rotate the infant in a  counterclockwise direction. These attempts were unsuccessful. The decision was made to discontinue any further attempts. The patient was monitored carefully with the ultrasound throughout her procedure. The was no evidence of fetal stress at any time. The patient was placed back on the nonstress test monitor for observation. We will plan to schedule a cesarean delivery.  Leonard Schwartz, M.D.  06/06/2015

## 2015-06-06 NOTE — H&P (Signed)
Admission History and Physical Exam for an Obstetrics Patient  [name] is a 29 y.o. female, [id number], at [redacted]w[redacted]d gestation, who presents for external version. She has been followed at the [facility] and Gynecology division of [facility].  Her pregnancy has been complicated by breech presentation. See history below.  OB History    Gravida Para Term Preterm AB TAB SAB Ectopic Multiple Living   7 1 1  2  2   1       Past Medical History  Diagnosis Date  . Medical history non-contributory   . Abnormal Pap smear AGE 22    COLPO; LAST PAP 2011  . Infection     UTI X 1  . Infection 2007    X 1  . Hx of varicella     Prescriptions prior to admission  Medication Sig Dispense Refill Last Dose  . ibuprofen (ADVIL,MOTRIN) 600 MG tablet Take 1 tablet (600 mg total) by mouth every 6 (six) hours as needed. 30 tablet 0   . oxyCODONE-acetaminophen (PERCOCET) 5-325 MG per tablet Take 1-2 tablets by mouth every 6 (six) hours as needed for severe pain. 30 tablet 0   . Prenatal Vit-Fe Fumarate-FA (PRENATAL MULTIVITAMIN) TABS tablet Take 1 tablet by mouth daily at 12 noon.   07/12/2014 at Unknown time    Past Surgical History  Procedure Laterality Date  . Superficial lymph node biopsy / excision    . Appendectomy    . Tonsillectomy    . Appendectomy  AGE 60  . Lymph nodes removed from neck  AGE 60  . Tonsillectomy  AGE 42  . Dilation and evacuation N/A 07/16/2014    Procedure: DILATATION AND EVACUATION;  Surgeon: Purcell Nails, MD;  Location: [facility];  Service: Gynecology;  Laterality: N/A;    No Known Allergies  Family History: family history includes Early death in her father; Epilepsy in her brother; Learning disabilities in her sister.  Social History:  reports that she quit smoking about 8 years ago. Her smoking use included Cigarettes. She smoked 0.30 packs per day. She has never used smokeless tobacco. She reports that she drinks alcohol. She  reports that she does not use illicit drugs.  Review of systems: Normal pregnancy complaints.  Admission Physical Exam:    BMI 37.9.  Last menstrual period 09/19/2014  HEENT:                 Within normal limits Chest:                   Clear Heart:                    Regular rate and rhythm Abdomen:             Gravid and nontender Extremities:          Grossly normal Neurologic exam: Grossly normal  Prenatal labs: ABO, Rh:             O/Positive/-- (02/26 0000) HBsAg:                 Negative (02/26 0000)  HIV:                       Non-reactive (02/26 0000)  GBS:                      Pending Antibody:  Negative (02/26 0000) Rubella:                 Immune RPR:                    Nonreactive (02/26 0000)      Prenatal Transfer Tool  Maternal Diabetes: No Genetic Screening: Declined Maternal Ultrasounds/Referrals: breech, LGA Fetal Ultrasounds or other Referrals:  None Maternal Substance Abuse:  No Significant Maternal Medications:  TDAP given Significant Maternal Lab Results:  None  Assessment:  [redacted]w[redacted]d gestation  Breech presentation  Increased BMI  LGA baby  Plan:  Ext Version   Elizabeth Chapman V 06/06/2015, 7:51 AM

## 2015-06-12 ENCOUNTER — Other Ambulatory Visit: Payer: Self-pay | Admitting: Obstetrics & Gynecology

## 2015-06-18 ENCOUNTER — Encounter (HOSPITAL_COMMUNITY): Payer: Self-pay

## 2015-06-18 ENCOUNTER — Encounter (HOSPITAL_COMMUNITY)
Admission: RE | Admit: 2015-06-18 | Discharge: 2015-06-18 | Disposition: A | Discharge status: Home / Self Care | Payer: BLUE CROSS/BLUE SHIELD | Source: Ambulatory Visit | Attending: Obstetrics & Gynecology | Admitting: Obstetrics & Gynecology

## 2015-06-18 LAB — CBC
HEMATOCRIT: 37.5 % (ref 36.0–46.0)
Hemoglobin: 13.1 g/dL (ref 12.0–15.0)
MCH: 30.4 pg (ref 26.0–34.0)
MCHC: 34.9 g/dL (ref 30.0–36.0)
MCV: 87 fL (ref 78.0–100.0)
Platelets: 183 10*3/uL (ref 150–400)
RBC: 4.31 MIL/uL (ref 3.87–5.11)
RDW: 14.8 % (ref 11.5–15.5)
WBC: 12.3 10*3/uL — ABNORMAL HIGH (ref 4.0–10.5)

## 2015-06-18 LAB — RPR: RPR: NONREACTIVE

## 2015-06-18 LAB — TYPE AND SCREEN
ABO/RH(D): O POS
Antibody Screen: NEGATIVE

## 2015-06-18 NOTE — Patient Instructions (Signed)
Your procedure is scheduled on: June 19, 2015  Enter through the Main Entrance of [facility] at:  12:00 pm   Pick up the phone at the desk and dial [phone].  Call this number if you have problems the morning of surgery: [phone].  Remember: Do NOT eat food: after midnight tonight  Do NOT drink clear liquids after:  9:30 day of surgery  Take these medicines the morning of surgery with a SIP OF WATER:  None   Do NOT wear jewelry (body piercing), metal hair clips/bobby pins, or nail polish. Do NOT wear lotions, powders, or perfumes.  You may wear deoderant. Do NOT shave for 48 hours prior to surgery. Do NOT bring valuables to the hospital. Leave suitcase in car.  After surgery it may be brought to your room.  For patients admitted to the hospital, checkout time is 11:00 AM the day of discharge.

## 2015-06-19 ENCOUNTER — Inpatient Hospital Stay (HOSPITAL_COMMUNITY): Payer: BLUE CROSS/BLUE SHIELD | Admitting: Anesthesiology

## 2015-06-19 ENCOUNTER — Inpatient Hospital Stay (HOSPITAL_COMMUNITY)
Admission: RE | Admit: 2015-06-19 | Discharge: 2015-06-21 | DRG: 765 | Disposition: A | Discharge status: Home / Self Care | Payer: BLUE CROSS/BLUE SHIELD | Source: Ambulatory Visit | Attending: Obstetrics & Gynecology | Admitting: Obstetrics & Gynecology

## 2015-06-19 ENCOUNTER — Encounter (HOSPITAL_COMMUNITY)
Admission: RE | Disposition: A | Discharge status: Home / Self Care | Payer: Self-pay | Source: Ambulatory Visit | Attending: Obstetrics & Gynecology

## 2015-06-19 ENCOUNTER — Encounter (HOSPITAL_COMMUNITY): Payer: Self-pay | Admitting: *Deleted

## 2015-06-19 DIAGNOSIS — Z820 Family history of epilepsy and other diseases of the nervous system: Secondary | ICD-10-CM

## 2015-06-19 DIAGNOSIS — O133 Gestational [pregnancy-induced] hypertension without significant proteinuria, third trimester: Secondary | ICD-10-CM | POA: Diagnosis present

## 2015-06-19 DIAGNOSIS — D649 Anemia, unspecified: Secondary | ICD-10-CM | POA: Diagnosis present

## 2015-06-19 DIAGNOSIS — Z87891 Personal history of nicotine dependence: Secondary | ICD-10-CM

## 2015-06-19 DIAGNOSIS — O9902 Anemia complicating childbirth: Secondary | ICD-10-CM | POA: Diagnosis present

## 2015-06-19 DIAGNOSIS — O321XX0 Maternal care for breech presentation, not applicable or unspecified: Principal | ICD-10-CM | POA: Diagnosis present

## 2015-06-19 DIAGNOSIS — Z3A39 39 weeks gestation of pregnancy: Secondary | ICD-10-CM | POA: Diagnosis present

## 2015-06-19 SURGERY — Surgical Case
Anesthesia: Spinal | Site: Abdomen

## 2015-06-19 MED ORDER — VITAMIN K1 1 MG/0.5ML IJ SOLN
INTRAMUSCULAR | Status: AC
  Filled 2015-06-19: qty 0.5

## 2015-06-19 MED ORDER — WITCH HAZEL-GLYCERIN EX PADS
1.0000 | MEDICATED_PAD | CUTANEOUS | Status: DC | PRN
Start: 2015-06-19 — End: 2015-06-21

## 2015-06-19 MED ORDER — LANOLIN HYDROUS EX OINT: 1.0000 "application " | TOPICAL_OINTMENT | CUTANEOUS | Status: DC | PRN

## 2015-06-19 MED ORDER — ACETAMINOPHEN 160 MG/5ML PO SOLN
ORAL | Status: AC
  Filled 2015-06-19: qty 40.6

## 2015-06-19 MED ORDER — SODIUM CHLORIDE 0.9 % IJ SOLN
3.0000 mL | INTRAMUSCULAR | Status: DC | PRN
Start: 2015-06-19 — End: 2015-06-21

## 2015-06-19 MED ORDER — ERYTHROMYCIN 5 MG/GM OP OINT
TOPICAL_OINTMENT | OPHTHALMIC | Status: AC
Start: 2015-06-19 — End: 2015-06-20
  Filled 2015-06-19: qty 1

## 2015-06-19 MED ORDER — ONDANSETRON HCL 4 MG/2ML IJ SOLN
INTRAMUSCULAR | Status: DC | PRN
  Administered 2015-06-19: 4 mg via INTRAVENOUS

## 2015-06-19 MED ORDER — OXYTOCIN 10 UNIT/ML IJ SOLN
INTRAMUSCULAR | Status: AC
  Filled 2015-06-19: qty 4

## 2015-06-19 MED ORDER — NALBUPHINE HCL 10 MG/ML IJ SOLN: 5.0000 mg | Freq: Once | INTRAMUSCULAR | Status: DC | PRN

## 2015-06-19 MED ORDER — DIPHENHYDRAMINE HCL 25 MG PO CAPS: 25.0000 mg | ORAL_CAPSULE | ORAL | Status: DC | PRN

## 2015-06-19 MED ORDER — LACTATED RINGERS IV SOLN
INTRAVENOUS | Status: DC | PRN
  Administered 2015-06-19: 15:00:00 via INTRAVENOUS

## 2015-06-19 MED ORDER — OXYCODONE-ACETAMINOPHEN 5-325 MG PO TABS
1.0000 | ORAL_TABLET | ORAL | Status: DC | PRN
  Administered 2015-06-20 – 2015-06-21 (×2): 1 via ORAL
  Filled 2015-06-19 (×2): qty 1

## 2015-06-19 MED ORDER — SIMETHICONE 80 MG PO CHEW
80.0000 mg | CHEWABLE_TABLET | ORAL | Status: DC
  Administered 2015-06-20 – 2015-06-21 (×2): 80 mg via ORAL
  Filled 2015-06-19 (×2): qty 1

## 2015-06-19 MED ORDER — SCOPOLAMINE 1 MG/3DAYS TD PT72: 1.0000 | MEDICATED_PATCH | Freq: Once | TRANSDERMAL | Status: DC

## 2015-06-19 MED ORDER — FENTANYL CITRATE (PF) 100 MCG/2ML IJ SOLN
INTRAMUSCULAR | Status: AC
  Filled 2015-06-19: qty 4

## 2015-06-19 MED ORDER — FENTANYL CITRATE (PF) 100 MCG/2ML IJ SOLN
INTRAMUSCULAR | Status: DC | PRN
  Administered 2015-06-19: 25 ug via INTRATHECAL

## 2015-06-19 MED ORDER — SCOPOLAMINE 1 MG/3DAYS TD PT72
1.0000 | MEDICATED_PATCH | Freq: Once | TRANSDERMAL | Status: DC
  Administered 2015-06-19: 1.5 mg via TRANSDERMAL

## 2015-06-19 MED ORDER — KETOROLAC TROMETHAMINE 30 MG/ML IJ SOLN
INTRAMUSCULAR | Status: AC
  Filled 2015-06-19: qty 1

## 2015-06-19 MED ORDER — MORPHINE SULFATE 0.5 MG/ML IJ SOLN
INTRAMUSCULAR | Status: AC
  Filled 2015-06-19: qty 100

## 2015-06-19 MED ORDER — CEFAZOLIN SODIUM-DEXTROSE 2-3 GM-% IV SOLR
2.0000 g | INTRAVENOUS | Status: AC
  Administered 2015-06-19: 2 g via INTRAVENOUS

## 2015-06-19 MED ORDER — PHENYLEPHRINE HCL 10 MG/ML IJ SOLN
INTRAMUSCULAR | Status: DC | PRN
  Administered 2015-06-19 (×2): 40 ug via INTRAVENOUS
  Administered 2015-06-19: 80 ug via INTRAVENOUS

## 2015-06-19 MED ORDER — OXYTOCIN 40 UNITS IN LACTATED RINGERS INFUSION - SIMPLE MED: 62.5000 mL/h | INTRAVENOUS | Status: AC

## 2015-06-19 MED ORDER — MENTHOL 3 MG MT LOZG: 1.0000 | LOZENGE | OROMUCOSAL | Status: DC | PRN

## 2015-06-19 MED ORDER — ZOLPIDEM TARTRATE 5 MG PO TABS: 5.0000 mg | ORAL_TABLET | Freq: Every evening | ORAL | Status: DC | PRN

## 2015-06-19 MED ORDER — DIPHENHYDRAMINE HCL 50 MG/ML IJ SOLN: 12.5000 mg | INTRAMUSCULAR | Status: DC | PRN

## 2015-06-19 MED ORDER — ACETAMINOPHEN 160 MG/5ML PO SOLN
975.0000 mg | Freq: Once | ORAL | Status: AC
  Administered 2015-06-19: 975 mg via ORAL

## 2015-06-19 MED ORDER — MORPHINE SULFATE (PF) 0.5 MG/ML IJ SOLN
INTRAMUSCULAR | Status: DC | PRN
  Administered 2015-06-19: .1 ug via INTRATHECAL

## 2015-06-19 MED ORDER — NALBUPHINE HCL 10 MG/ML IJ SOLN: 5.0000 mg | INTRAMUSCULAR | Status: DC | PRN

## 2015-06-19 MED ORDER — NALOXONE HCL 0.4 MG/ML IJ SOLN: 0.4000 mg | INTRAMUSCULAR | Status: DC | PRN

## 2015-06-19 MED ORDER — CEFAZOLIN SODIUM-DEXTROSE 2-3 GM-% IV SOLR
INTRAVENOUS | Status: AC
Start: 2015-06-19 — End: 2015-06-20
  Filled 2015-06-19: qty 50

## 2015-06-19 MED ORDER — SIMETHICONE 80 MG PO CHEW: 80.0000 mg | CHEWABLE_TABLET | ORAL | Status: DC | PRN

## 2015-06-19 MED ORDER — DEXTROSE 5 % IV SOLN: 1.0000 ug/kg/h | INTRAVENOUS | Status: DC | PRN

## 2015-06-19 MED ORDER — ONDANSETRON HCL 4 MG/2ML IJ SOLN
INTRAMUSCULAR | Status: AC
  Filled 2015-06-19: qty 2

## 2015-06-19 MED ORDER — SIMETHICONE 80 MG PO CHEW
80.0000 mg | CHEWABLE_TABLET | Freq: Three times a day (TID) | ORAL | Status: DC
Start: 2015-06-19 — End: 2015-06-21
  Administered 2015-06-20 – 2015-06-21 (×4): 80 mg via ORAL
  Filled 2015-06-19 (×4): qty 1

## 2015-06-19 MED ORDER — PHENYLEPHRINE 8 MG IN D5W 100 ML (0.08MG/ML) PREMIX OPTIME
INJECTION | INTRAVENOUS | Status: DC | PRN
  Administered 2015-06-19: 80 ug/min via INTRAVENOUS

## 2015-06-19 MED ORDER — LACTATED RINGERS IV SOLN
INTRAVENOUS | Status: DC | PRN
  Administered 2015-06-19 (×2): via INTRAVENOUS

## 2015-06-19 MED ORDER — SCOPOLAMINE 1 MG/3DAYS TD PT72
MEDICATED_PATCH | TRANSDERMAL | Status: AC
  Administered 2015-06-19: 1.5 mg via TRANSDERMAL
  Filled 2015-06-19: qty 1

## 2015-06-19 MED ORDER — OXYTOCIN 10 UNIT/ML IJ SOLN
40.0000 [IU] | INTRAMUSCULAR | Status: DC | PRN
  Administered 2015-06-19: 40 [IU] via INTRAVENOUS

## 2015-06-19 MED ORDER — PRENATAL MULTIVITAMIN CH
1.0000 | ORAL_TABLET | Freq: Every day | ORAL | Status: DC
  Administered 2015-06-20 – 2015-06-21 (×2): 1 via ORAL
  Filled 2015-06-19 (×2): qty 1

## 2015-06-19 MED ORDER — SENNOSIDES-DOCUSATE SODIUM 8.6-50 MG PO TABS
2.0000 | ORAL_TABLET | ORAL | Status: DC
  Administered 2015-06-20 – 2015-06-21 (×2): 2 via ORAL
  Filled 2015-06-19 (×2): qty 2

## 2015-06-19 MED ORDER — OXYCODONE-ACETAMINOPHEN 5-325 MG PO TABS: 2.0000 | ORAL_TABLET | ORAL | Status: DC | PRN

## 2015-06-19 MED ORDER — TETANUS-DIPHTH-ACELL PERTUSSIS 5-2.5-18.5 LF-MCG/0.5 IM SUSP: 0.5000 mL | Freq: Once | INTRAMUSCULAR | Status: DC

## 2015-06-19 MED ORDER — ACETAMINOPHEN 325 MG PO TABS: 650.0000 mg | ORAL_TABLET | ORAL | Status: DC | PRN

## 2015-06-19 MED ORDER — ONDANSETRON HCL 4 MG/2ML IJ SOLN: 4.0000 mg | Freq: Three times a day (TID) | INTRAMUSCULAR | Status: DC | PRN

## 2015-06-19 MED ORDER — LACTATED RINGERS IV SOLN
INTRAVENOUS | Status: DC
  Administered 2015-06-19: 17:00:00 via INTRAVENOUS

## 2015-06-19 MED ORDER — IBUPROFEN 600 MG PO TABS
600.0000 mg | ORAL_TABLET | Freq: Four times a day (QID) | ORAL | Status: DC
  Administered 2015-06-20 – 2015-06-21 (×7): 600 mg via ORAL
  Filled 2015-06-19 (×7): qty 1

## 2015-06-19 MED ORDER — PHENYLEPHRINE 8 MG IN D5W 100 ML (0.08MG/ML) PREMIX OPTIME
INJECTION | INTRAVENOUS | Status: AC
  Filled 2015-06-19: qty 100

## 2015-06-19 MED ORDER — DIBUCAINE 1 % RE OINT: 1.0000 "application " | TOPICAL_OINTMENT | RECTAL | Status: DC | PRN

## 2015-06-19 MED ORDER — ACETAMINOPHEN 500 MG PO TABS: 1000.0000 mg | ORAL_TABLET | Freq: Four times a day (QID) | ORAL | Status: AC

## 2015-06-19 MED ORDER — LACTATED RINGERS IV SOLN
Freq: Once | INTRAVENOUS | Status: AC
  Administered 2015-06-19: 12:00:00 via INTRAVENOUS

## 2015-06-19 MED ORDER — PHENYLEPHRINE 40 MCG/ML (10ML) SYRINGE FOR IV PUSH (FOR BLOOD PRESSURE SUPPORT)
PREFILLED_SYRINGE | INTRAVENOUS | Status: AC
  Filled 2015-06-19: qty 10

## 2015-06-19 MED ORDER — DIPHENHYDRAMINE HCL 25 MG PO CAPS: 25.0000 mg | ORAL_CAPSULE | Freq: Four times a day (QID) | ORAL | Status: DC | PRN

## 2015-06-19 MED ORDER — LACTATED RINGERS IV SOLN
INTRAVENOUS | Status: DC
  Administered 2015-06-20: 01:00:00 via INTRAVENOUS

## 2015-06-19 SURGICAL SUPPLY — 33 items
CLAMP CORD UMBIL (MISCELLANEOUS) ×3 IMPLANT
CLOTH BEACON ORANGE TIMEOUT ST (SAFETY) ×3 IMPLANT
DRAPE SHEET LG 3/4 BI-LAMINATE (DRAPES) ×3 IMPLANT
DRSG OPSITE POSTOP 4X10 (GAUZE/BANDAGES/DRESSINGS) ×3 IMPLANT
DURAPREP 26ML APPLICATOR (WOUND CARE) ×3 IMPLANT
ELECT REM PT RETURN 9FT ADLT (ELECTROSURGICAL) ×3
ELECTRODE REM PT RTRN 9FT ADLT (ELECTROSURGICAL) ×1 IMPLANT
EXTRACTOR VACUUM M CUP 4 TUBE (SUCTIONS) IMPLANT
EXTRACTOR VACUUM M CUP 4' TUBE (SUCTIONS)
GLOVE BIOGEL PI IND STRL 7.0 (GLOVE) ×1 IMPLANT
GLOVE BIOGEL PI INDICATOR 7.0 (GLOVE) ×2
GLOVE SURG SS PI 6.5 STRL IVOR (GLOVE) ×3 IMPLANT
GOWN STRL REUS W/TWL LRG LVL3 (GOWN DISPOSABLE) ×6 IMPLANT
KIT ABG SYR 3ML LUER SLIP (SYRINGE) IMPLANT
LIQUID BAND (GAUZE/BANDAGES/DRESSINGS) ×3 IMPLANT
NEEDLE HYPO 25X5/8 SAFETYGLIDE (NEEDLE) IMPLANT
NS IRRIG 1000ML POUR BTL (IV SOLUTION) ×3 IMPLANT
PACK C SECTION WH (CUSTOM PROCEDURE TRAY) ×3 IMPLANT
PAD OB MATERNITY 4.3X12.25 (PERSONAL CARE ITEMS) ×3 IMPLANT
RTRCTR C-SECT PINK 25CM LRG (MISCELLANEOUS) ×3 IMPLANT
SUT CHROMIC 1 CTX 36 (SUTURE) IMPLANT
SUT CHROMIC 2 0 CT 1 (SUTURE) ×3 IMPLANT
SUT MON AB 4-0 PS1 27 (SUTURE) ×3 IMPLANT
SUT PLAIN 1 NONE 54 (SUTURE) IMPLANT
SUT PLAIN 2 0 (SUTURE)
SUT PLAIN 2 0 XLH (SUTURE) ×3 IMPLANT
SUT PLAIN ABS 2-0 CT1 27XMFL (SUTURE) IMPLANT
SUT VIC AB 0 CTX 36 (SUTURE) ×2
SUT VIC AB 0 CTX36XBRD ANBCTRL (SUTURE) ×1 IMPLANT
SUT VIC AB 1 CTX 36 (SUTURE) ×4
SUT VIC AB 1 CTX36XBRD ANBCTRL (SUTURE) ×2 IMPLANT
TOWEL OR 17X24 6PK STRL BLUE (TOWEL DISPOSABLE) ×3 IMPLANT
TRAY FOLEY CATH SILVER 14FR (SET/KITS/TRAYS/PACK) ×3 IMPLANT

## 2015-06-19 NOTE — Consult Note (Signed)
Neonatology Note:   Attendance at C-section:    I was asked by Dr. Sallye Ober to attend this primary C/S at term due to breech presentation. The mother is a G5P2A2 O pos, GBS pos with breech presentation. ROM at delivery, fluid clear. Infant vigorous with good spontaneous cry and tone. Needed only minimal bulb suctioning. Ap 9/9. Lungs clear to ausc in DR. To CN to care of Pediatrician.   Doretha Sou, MD

## 2015-06-19 NOTE — Brief Op Note (Signed)
06/19/2015  5:24 PM  PATIENT:  Konrad Felix Sadlowski  29 y.o. female  PRE-OPERATIVE DIAGNOSIS:  Breech Presention, IUP at 39 weeks  POST-OPERATIVE DIAGNOSIS:  breech presentation,  PROCEDURE:  Procedure(s): CESAREAN SECTION (N/A)  SURGEON:  Surgeon(s) and Role:    * Hoover Browns, MD - Primary  ASSISTANTS: CNM Venus Standard   ANESTHESIA:   spinal  EBL:  Total I/O In: 3900 [I.V.:3900] Out: 1425 [Urine:525; Blood:900]  BLOOD ADMINISTERED:none  DRAINS: none   LOCAL MEDICATIONS USED:  None  SPECIMEN:  Source of Specimen:  Cord blood  DISPOSITION OF SPECIMEN:  N/A  COUNTS:  YES  TOURNIQUET:  * No tourniquets in log *  DICTATION: .Dragon Dictation  PLAN OF CARE: Admit to inpatient   PATIENT DISPOSITION:  PACU - hemodynamically stable.   Delay start of Pharmacological VTE agent (>24hrs) due to surgical blood loss or risk of bleeding: not applicable

## 2015-06-19 NOTE — Interval H&P Note (Signed)
History and Physical Interval Note:  06/19/2015 1:47 PM  Elizabeth Chapman  has presented today for surgery, with the diagnosis of Breech Presention, IUP at 39 weeks  The various methods of treatment have been discussed with the patient and family. After consideration of risks, benefits and other options for treatment, the patient has consented to  Procedure(s): CESAREAN SECTION (N/A) as a surgical intervention .  The patient's history has been reviewed, patient examined, no change in status, stable for surgery.  I have reviewed the patient's chart and labs.  Questions were answered to the patient's satisfaction.     [facility]

## 2015-06-19 NOTE — Transfer of Care (Signed)
Immediate Anesthesia Transfer of Care Note  Patient: Elizabeth Chapman  Procedure(s) Performed: Procedure(s): CESAREAN SECTION (N/A)  Patient Location: PACU  Anesthesia Type:Spinal  Level of Consciousness: awake, alert  and oriented  Airway & Oxygen Therapy: Patient Spontanous Breathing  Post-op Assessment: Report given to RN and Post -op Vital signs reviewed and stable  Post vital signs: Reviewed and stable  Last Vitals:  Filed Vitals:   06/19/15 1209  BP: 126/77  Pulse: 97  Temp: 37.1 C  Resp: 18    Complications: No apparent anesthesia complications

## 2015-06-19 NOTE — Op Note (Signed)
DATE OF SURGERY: 06/24/2015   PREOP DIAGNOSIS:  1. [redacted] week EGA IUP.  2.  Primary cesarean section secondary to breech presentation.  POSTOP DIAGNOSIS: Same as above.  PROCEDURE: Primary low uterine segment transverse cesarean section via Pfannenstiel incision.     SURGEON: Dr.  Hoover Browns  ASSISTANT: Venus Standard, CNM  ANESTHESIA: Spinal  COMPLICATIONS: None  FINDINGS: Viable female infant in breech presentation, DSP, weight 7 pounds 12.5 ounces, Apgar scores of 9 and 9. Normal uterus and fallopian tubes and ovaries bilaterally.    EBL: 900 cc  IV FLUID: 3900 cc LR   URINE OUTPUT: 525 cc clear urine  INDICATIONS: 28 y/o P1 at 35W EGA with breech presenting fetus.  Patient had had a failed external cephalic version and now desired delivery by C-section. She was consented for the procedure after explaining risks benefits and alternatives of the procedure.    PROCEDURE:   Informed consent was obtained from the patient to undergo the procedure. She was taken to the operating room where her spinal anesthesia was found to be adequate. She was prepped and draped in the usual sterile fashion and a Foley catheter was placed. She received 2 g of IV Ancef preoperatively. A Pfannenstiel incision was made with the scalpel and the incision extended through the subcutaneous layer and also the fascia with the bovie. Small perforators in the subcutaneous layer and fascia were contained with the Bovie. The fascia was nicked in the midline and then was further separated from the rectus muscles bilaterally using Mayo scissors. Kochers were placed inferiorly and then superiorly to allow further separation of fascia from the rectus muscles.  The peritoneal cavity was entered bluntly with the fingers. The Alexis retractor was placed in. The bladder flap was created using Metzenbaum scissors.   The uterus was incised with a scalpel and the incision extended bluntly bilaterally with fingers. Bandage scissors  were used to extend uterine incision to the right.  Clear amniotic fluid was noted.  The sacrum was delivered, noted to be DSP position, therefore fetus was rotated to DSA position.  Feet were delivered.  Gentle traction was applied on sacrum to deliver  The trunk, then baby rotated slightly to the right to deliver the left arm, then slightly to the left to deliver the right arm.  The head was flexed at maxillary region and delivered, with loose nuchal cord noted. All this was done atraumatically.  She delivered a viable female infant, apgar scores 9,9.  The cord was clamped and cut. Cord blood was collected.    The uterus was not exteriorized.  The placenta was delivered with gentle traction on the umbilical cord. The edges of the uterus was grasped with Allis clamps and also T. Clamps. The uterus was cleared of clots and debris with a lap.  The incision uterine incision was closed with #1 Vicryl in a running locked stitch. An imbricating layer of came stitch was placed over the initial closure.  A small area that bled on the left side was contained with figure of 8 stitch.  Irrigation was applied and suctioned out. Excellent hemostasis was noted over the incision.  The tubes and ovaries palpated normally bilaterally.    The muscles were then reapproximated using chromic suture.  Fascia was closed using 0 Vicryl in a running stitch. The subcutaneous layer was irrigated and suctioned out.  The subcutaneous was closed over using 1-0 plain in running stitch.  The skin was closed using 4-0 Monocryl. Dermabond was  applied. Honeycomb was then applied. The patient was then cleaned and she was taken to the recovery room in stable condition. The neonate was also taken to the recovery room with mother in stable condition.   SPECIMEN: Placenta, umbilical cord blood  DISPOSITION: TO PACU, STABLE.

## 2015-06-19 NOTE — Anesthesia Procedure Notes (Signed)

## 2015-06-19 NOTE — H&P (Signed)
Elizabeth Chapman is a 29 y.o. female presenting for a primary cesarean section for breech presentation at [redacted] weeks EGA.  EDC 06/26/15.  Maternal Medical History:  Prenatal complications: PIH.     OB History    Gravida Para Term Preterm AB TAB SAB Ectopic Multiple Living   Past Medical History  Diagnosis Date  . Medical history non-contributory   . Abnormal Pap smear AGE 54    COLPO; LAST PAP 2011  . Infection     UTI X 1  . Infection 2007    X 1  . Hx of varicella   . Vaginal delivery 2014   Past Surgical History  Procedure Laterality Date  . Superficial lymph node biopsy / excision    . Appendectomy    . Tonsillectomy    . Appendectomy  AGE 75  . Lymph nodes removed from neck  AGE 75  . Tonsillectomy  AGE 68  . Dilation and evacuation N/A 07/16/2014    Procedure: DILATATION AND EVACUATION;  Surgeon: Purcell Nails, MD;  Location: [facility];  Service: Gynecology;  Laterality: N/A;   Family History: family history includes Early death in her father; Epilepsy in her brother; Learning disabilities in her sister. Social History:  reports that she quit smoking about 8 years ago. Her smoking use included Cigarettes. She smoked 0.30 packs per day. She has never used smokeless tobacco. She reports that she drinks alcohol. She reports that she does not use illicit drugs.   Prenatal Transfer Tool  Maternal Diabetes: No Genetic Screening: Normal Maternal Ultrasounds/Referrals: Normal Fetal Ultrasounds or other Referrals:  Other:  8/2: EFW 7lbs 4 oz (93%). AFI 11.  FRANK BREECH.  Maternal Substance Abuse:  No Significant Maternal Medications:  None Significant Maternal Lab Results:  None Other Comments:  None  ROS Constitutional: Denies fevers/chills Cardiovascular: Denies chest pain, palpitations. Pulmonary: Denies coughing or wheezing Gastrointestinal: Denies nausea, vomiting Genitourinary: Denies dysuria, urgency, frequency.    Blood pressure 126/77, pulse  97, temperature 98.8 F (37.1 C), temperature source Oral, resp. rate 18, last menstrual period 09/19/2014, SpO2 98 %, unknown if currently breastfeeding. Exam Physical Exam  Gen: NAD Pulm: CTAB Abd:P Soft/Nontender/Gravid Ext: Warm and well purfused, no calf tenderness Bedside ultrasound: Breech presentation.  Anterior placenta.  Prenatal labs: ABO, Rh: --/--/O POS (08/23 0840) Antibody: NEG (08/23 0840) Rubella: Immune (02/26 0000) RPR: Non Reactive (08/23 0840)  HBsAg: Negative (02/26 0000)  HIV: Non-reactive, Non-reactive (02/26 0000)  GBS:   Pos  Assessment/Plan: Admit for cesarean section for breech presentation Discussed risks, benefits and alternatives of a cesarean delivery including risks of bleeding, infection, damage to organs, blood transfusion.  All questions answered.    [facility] 06/19/2015, 1:34 PM

## 2015-06-19 NOTE — Anesthesia Postprocedure Evaluation (Signed)
  Anesthesia Post-op Note  Patient: Elizabeth Chapman  Procedure(s) Performed: Procedure(s): CESAREAN SECTION (N/A)  Patient is awake, responsive, moving her legs, and has signs of resolution of her numbness. Pain and nausea are reasonably well controlled. Vital signs are stable and clinically acceptable. Oxygen saturation is clinically acceptable. There are no apparent anesthetic complications at this time. Patient is ready for discharge.

## 2015-06-20 ENCOUNTER — Encounter (HOSPITAL_COMMUNITY): Payer: Self-pay | Admitting: Obstetrics & Gynecology

## 2015-06-20 LAB — CBC
HCT: 26.1 % — ABNORMAL LOW (ref 36.0–46.0)
Hemoglobin: 9.1 g/dL — ABNORMAL LOW (ref 12.0–15.0)
MCH: 30.7 pg (ref 26.0–34.0)
MCHC: 34.9 g/dL (ref 30.0–36.0)
MCV: 88.2 fL (ref 78.0–100.0)
PLATELETS: 163 10*3/uL (ref 150–400)
RBC: 2.96 MIL/uL — AB (ref 3.87–5.11)
RDW: 15.1 % (ref 11.5–15.5)
WBC: 10.4 10*3/uL (ref 4.0–10.5)

## 2015-06-20 MED ORDER — FERROUS SULFATE 325 (65 FE) MG PO TABS
325.0000 mg | ORAL_TABLET | Freq: Two times a day (BID) | ORAL | Status: DC
  Administered 2015-06-20 – 2015-06-21 (×3): 325 mg via ORAL
  Filled 2015-06-20 (×3): qty 1

## 2015-06-20 NOTE — Anesthesia Postprocedure Evaluation (Signed)
  Anesthesia Post-op Note  Patient: Consulting civil engineer  Procedure(s) Performed: Procedure(s): CESAREAN SECTION (N/A)  Patient Location: PACU and Mother/Baby  Anesthesia Type:Spinal  Level of Consciousness: awake, alert  and oriented  Airway and Oxygen Therapy: Patient Spontanous Breathing  Post-op Pain: mild  Post-op Assessment: Post-op Vital signs reviewed              Post-op Vital Signs: Reviewed and stable  Last Vitals:  Filed Vitals:   06/20/15 0520  BP: 104/55  Pulse: 90  Temp: 36.9 C  Resp: 18    Complications: No apparent anesthesia complications

## 2015-06-20 NOTE — Progress Notes (Addendum)
Subjective: Postpartum Day 1: Cesarean Delivery due to breech Patient up ad lib, reports no syncope or dizziness. Feeding:  brast Contraceptive plan:  unsure  Objective: Vital signs in last 24 hours: Temp:  [98 F (36.7 C)-99.3 F (37.4 C)] 98.5 F (36.9 C) (08/25 0520) Pulse Rate:  [86-97] 90 (08/25 0520) Resp:  [14-24] 18 (08/25 0520) BP: (94-131)/(50-77) 104/55 mmHg (08/25 0520) SpO2:  [95 %-98 %] 97 % (08/25 0100)  Physical Exam:  General: alert and cooperative Lochia: appropriate Uterine Fundus: firm Abdomen:  + bowel sounds, non distended Incision: no significant drainage  Honeycomb dressing CDI DVT Evaluation: No evidence of DVT seen on physical exam. Homan's sign: Negative   Recent Labs  06/18/15 0840 06/20/15 0530  HGB 13.1 9.1*  HCT 37.5 26.1*  WBC 12.3* 10.4     Assessment: Status post Cesarean section day 1. Doing well postoperatively.  Honeycomb dressing in place, no significant drainage Anemia - hemodynamicly stable.     Plan: Continue current care. Breastfeeding and Lactation consult Iron supplement Orthostatics   Elizabeth Chapman, CNM, MSN 06/20/2015. 7:31 AM

## 2015-06-20 NOTE — Addendum Note (Signed)
Addendum  created 06/20/15 1610 by Armanda Heritage, CRNA   Modules edited: Notes Section   Notes Section:  File: [id number]

## 2015-06-20 NOTE — Lactation Note (Signed)
This note was copied from the chart of Elizabeth Tulani Gugel. Lactation Consultation Note: Lactation Brochure given with basic teaching done. Mother states that she breastfed her first child for 18 months. Mother states that infant is feeding well. Observed infant with good depth and frequent swallows. Mother ask for a hand pump and given with instructions. Mother was receptive to all teaching. She will page if needed.  Patient Name: Elizabeth Chapman [id number] Date: 06/20/2015 Reason for consult: Initial assessment   Maternal Data Has patient been taught Hand Expression?: Yes  Feeding Feeding Type: Breast Fed Length of feed: 30 min  LATCH Score/Interventions Latch: Grasps breast easily, tongue down, lips flanged, rhythmical sucking.  Audible Swallowing: Spontaneous and intermittent Intervention(s): Hand expression  Type of Nipple: Everted at rest and after stimulation  Comfort (Breast/Nipple): Soft / non-tender     Hold (Positioning): Assistance needed to correctly position infant at breast and maintain latch.  LATCH Score: 9  Lactation Tools Discussed/Used     Consult Status Consult Status: Follow-up Date: 06/20/15 Follow-up type: In-patient    Stevan Born [facility] 06/20/2015, 5:06 PM

## 2015-06-21 MED ORDER — SENNOSIDES-DOCUSATE SODIUM 8.6-50 MG PO TABS: 2.0000 | ORAL_TABLET | Freq: Every day | ORAL | Status: DC

## 2015-06-21 MED ORDER — OXYCODONE-ACETAMINOPHEN 5-325 MG PO TABS: 1.0000 | ORAL_TABLET | Freq: Four times a day (QID) | ORAL | Status: DC | PRN

## 2015-06-21 MED ORDER — IBUPROFEN 600 MG PO TABS: 600.0000 mg | ORAL_TABLET | Freq: Four times a day (QID) | ORAL | Status: DC | PRN

## 2015-06-21 MED ORDER — FERROUS SULFATE 325 (65 FE) MG PO TABS: 325.0000 mg | ORAL_TABLET | Freq: Two times a day (BID) | ORAL | Status: DC

## 2015-06-21 NOTE — Lactation Note (Signed)
This note was copied from the chart of Elizabeth Kyren Sample. Lactation Consultation Note  Patient Name: Elizabeth Chapman [id number] Date: 06/21/2015 Reason for consult: Follow-up assessment Baby 45 hours old. Mom states baby nursing well and she is able to see that baby is transferring milk with lots of good swallows. Mom states that she nursed first baby and she had to remove dairy from her own diet due to baby's bloody stools. Mom states baby able to tolerate dairy now. Mom aware of OP/BFSG and LC phone line assistance after D/C.   Maternal Data Does the patient have breastfeeding experience prior to this delivery?: Yes  Feeding Feeding Type: Breast Fed  LATCH Score/Interventions Latch: Grasps breast easily, tongue down, lips flanged, rhythmical sucking.  Audible Swallowing: A few with stimulation Intervention(s): Skin to skin  Type of Nipple: Everted at rest and after stimulation  Comfort (Breast/Nipple): Soft / non-tender     Hold (Positioning): No assistance needed to correctly position infant at breast. Intervention(s): Breastfeeding basics reviewed;Support Pillows;Position options;Skin to skin  LATCH Score: 9  Lactation Tools Discussed/Used     Consult Status Consult Status: PRN    Geralynn Ochs 06/21/2015, 11:39 AM

## 2015-06-21 NOTE — Progress Notes (Signed)
Elizabeth Chapman [id number] Postpartum Day 2 S/P Primary Cesarean Section due to Breech presentation  Subjective: Patient up ad lib, denies syncope or dizziness. Reports consuming regular diet without issues and denies N/V. Patient reports no bowel movement and is passing flatus.  Denies issues with urination and reports bleeding is "normal."  Patient is breastfeeding and reports going well.  Desires IUD for postpartum contraception.  Pain is being appropriately managed with use of motrin and occasional percocet. Patient expresses desire for discharge today.  Objective: Temp:  [98 F (36.7 C)-98.5 F (36.9 C)] 98 F (36.7 C) (08/26 0545) Pulse Rate:  [92-106] 98 (08/26 0545) Resp:  [16-18] 16 (08/26 0545) BP: (110-120)/(53-63) 120/62 mmHg (08/26 0545) SpO2:  [99 %] 99 % (08/25 0928) Weight:  [97.07 kg (214 lb)] 97.07 kg (214 lb) (08/25 2344)   Recent Labs  06/18/15 0840 06/20/15 0530  HGB 13.1 9.1*  HCT 37.5 26.1*  WBC 12.3* 10.4    Physical Exam:  General: alert, cooperative and no distress Mood/Affect: Appropriate/Bright Lungs: clear to auscultation, no wheezes, rales or rhonchi, symmetric air entry.  Heart: normal rate and regular rhythm. Breast: breasts appear normal, no suspicious masses, no skin or nipple changes or axillary nodes. Abdomen:  + bowel sounds, Moderate Distention, Nontender Incision: no significant drainage on Honeycomb dressing  Uterine Fundus: firm, U/-2 Lochia: appropriate Skin: Warm, Dry. DVT Evaluation: No evidence of DVT seen on physical exam. JP drain:   None  Assessment Post Operative Day 2 S/P Primary C/S Normal Involution BreastFeeding Hemodynamically Stable  Plan: -Nurse instructed to call provider if infant okay for discharge -Discharge instructions given, no questions or concerns -Continue other mgmt as ordered -Dr. Kathie Rhodes. Rivard to be updated on patient status  Marlene Bast MSN, CNM 06/21/2015, 8:08 AM

## 2015-06-21 NOTE — Discharge Instructions (Signed)
Intrauterine Device Information An intrauterine device (IUD) is inserted into your uterus to prevent pregnancy. There are two types of IUDs available:   Copper IUD--This type of IUD is wrapped in copper wire and is placed inside the uterus. Copper makes the uterus and fallopian tubes produce a fluid that kills sperm. The copper IUD can stay in place for 10 years.  Hormone IUD--This type of IUD contains the hormone progestin (synthetic progesterone). The hormone thickens the cervical mucus and prevents sperm from entering the uterus. It also thins the uterine lining to prevent implantation of a fertilized egg. The hormone can weaken or kill the sperm that get into the uterus. One type of hormone IUD can stay in place for 5 years, and another type can stay in place for 3 years. Your health care provider will make sure you are a good candidate for a contraceptive IUD. Discuss with your health care provider the possible side effects.  ADVANTAGES OF AN INTRAUTERINE DEVICE  IUDs are highly effective, reversible, long acting, and low maintenance.   There are no estrogen-related side effects.   An IUD can be used when breastfeeding.   IUDs are not associated with weight gain.   The copper IUD works immediately after insertion.   The hormone IUD works right away if inserted within 7 days of your period starting. You will need to use a backup method of birth control for 7 days if the hormone IUD is inserted at any other time in your cycle.  The copper IUD does not interfere with your female hormones.   The hormone IUD can make heavy menstrual periods lighter and decrease cramping.   The hormone IUD can be used for 3 or 5 years.   The copper IUD can be used for 10 years. DISADVANTAGES OF AN INTRAUTERINE DEVICE  The hormone IUD can be associated with irregular bleeding patterns.   The copper IUD can make your menstrual flow heavier and more painful.   You may experience cramping and  vaginal bleeding after insertion.  Document Released: 09/15/2004 Document Revised: 06/14/2013 Document Reviewed: 04/02/2013 [facility] Patient Information 2015 [place], [place]. This information is not intended to replace advice given to you by your health care provider. Make sure you discuss any questions you have with your health care provider. Postpartum Care After Cesarean Delivery After you deliver your newborn (postpartum period), the usual stay in the hospital is 24-72 hours. If there were problems with your labor or delivery, or if you have other medical problems, you might be in the hospital longer.  While you are in the hospital, you will receive help and instructions on how to care for yourself and your newborn during the postpartum period.  While you are in the hospital:  It is normal for you to have pain or discomfort from the incision in your abdomen. Be sure to tell your nurses when you are having pain, where the pain is located, and what makes the pain worse.  If you are breastfeeding, you may feel uncomfortable contractions of your uterus for a couple of weeks. This is normal. The contractions help your uterus get back to normal size.  It is normal to have some bleeding after delivery.  For the first 1-3 days after delivery, the flow is red and the amount may be similar to a period.  It is common for the flow to start and stop.  In the first few days, you may pass some small clots. Let your nurses know if  you begin to pass large clots or your flow increases.  Do not  flush blood clots down the toilet before having the nurse look at them.  During the next 3-10 days after delivery, your flow should become more watery and pink or brown-tinged in color.  Ten to fourteen days after delivery, your flow should be a small amount of yellowish-white discharge.  The amount of your flow will decrease over the first few weeks after delivery. Your flow may stop in 6-8 weeks. Most women  have had their flow stop by 12 weeks after delivery.  You should change your sanitary pads frequently.  Wash your hands thoroughly with soap and water for at least 20 seconds after changing pads, using the toilet, or before holding or feeding your newborn.  Your intravenous (IV) tubing will be removed when you are drinking enough fluids.  The urine drainage tube (urinary catheter) that was inserted before delivery may be removed within 6-8 hours after delivery or when feeling returns to your legs. You should feel like you need to empty your bladder within the first 6-8 hours after the catheter has been removed.  In case you become weak, lightheaded, or faint, call your nurse before you get out of bed for the first time and before you take a shower for the first time.  Within the first few days after delivery, your breasts may begin to feel tender and full. This is called engorgement. Breast tenderness usually goes away within 48-72 hours after engorgement occurs. You may also notice milk leaking from your breasts. If you are not breastfeeding, do not stimulate your breasts. Breast stimulation can make your breasts produce more milk.  Spending as much time as possible with your newborn is very important. During this time, you and your newborn can feel close and get to know each other. Having your newborn stay in your room (rooming in) will help to strengthen the bond with your newborn. It will give you time to get to know your newborn and become comfortable caring for your newborn.  Your hormones change after delivery. Sometimes the hormone changes can temporarily cause you to feel sad or tearful. These feelings should not last more than a few days. If these feelings last longer than that, you should talk to your caregiver.  If desired, talk to your caregiver about methods of family planning or contraception.  Talk to your caregiver about immunizations. Your caregiver may want you to have the  following immunizations before leaving the hospital:  Tetanus, diphtheria, and pertussis (Tdap) or tetanus and diphtheria (Td) immunization. It is very important that you and your family (including grandparents) or others caring for your newborn are up-to-date with the Tdap or Td immunizations. The Tdap or Td immunization can help protect your newborn from getting ill.  Rubella immunization.  Varicella (chickenpox) immunization.  Influenza immunization. You should receive this annual immunization if you did not receive the immunization during your pregnancy. Document Released: 07/06/2012 Document Reviewed: 07/06/2012 [facility] Patient Information 2015 [place], [place]. This information is not intended to replace advice given to you by your health care provider. Make sure you discuss any questions you have with your health care provider. Postpartum Depression and Baby Blues The postpartum period begins right after the birth of a baby. During this time, there is often a great amount of joy and excitement. It is also a time of many changes in the life of the parents. Regardless of how many times a mother gives birth,  each child brings new challenges and dynamics to the family. It is not unusual to have feelings of excitement along with confusing shifts in moods, emotions, and thoughts. All mothers are at risk of developing postpartum depression or the "baby blues." These mood changes can occur right after giving birth, or they may occur many months after giving birth. The baby blues or postpartum depression can be mild or severe. Additionally, postpartum depression can go away rather quickly, or it can be a long-term condition.  CAUSES Raised hormone levels and the rapid drop in those levels are thought to be a main cause of postpartum depression and the baby blues. A number of hormones change during and after pregnancy. Estrogen and progesterone usually decrease right after the delivery of your baby. The  levels of thyroid hormone and various cortisol steroids also rapidly drop. Other factors that play a role in these mood changes include major life events and genetics.  RISK FACTORS If you have any of the following risks for the baby blues or postpartum depression, know what symptoms to watch out for during the postpartum period. Risk factors that may increase the likelihood of getting the baby blues or postpartum depression include:  Having a personal or family history of depression.   Having depression while being pregnant.   Having premenstrual mood issues or mood issues related to oral contraceptives.  Having a lot of life stress.   Having marital conflict.   Lacking a social support network.   Having a baby with special needs.   Having health problems, such as diabetes.  SIGNS AND SYMPTOMS Symptoms of baby blues include:  Brief changes in mood, such as going from extreme happiness to sadness.  Decreased concentration.   Difficulty sleeping.   Crying spells, tearfulness.   Irritability.   Anxiety.  Symptoms of postpartum depression typically begin within the first month after giving birth. These symptoms include:  Difficulty sleeping or excessive sleepiness.   Marked weight loss.   Agitation.   Feelings of worthlessness.   Lack of interest in activity or food.  Postpartum psychosis is a very serious condition and can be dangerous. Fortunately, it is rare. Displaying any of the following symptoms is cause for immediate medical attention. Symptoms of postpartum psychosis include:   Hallucinations and delusions.   Bizarre or disorganized behavior.   Confusion or disorientation.  DIAGNOSIS  A diagnosis is made by an evaluation of your symptoms. There are no medical or lab tests that lead to a diagnosis, but there are various questionnaires that a health care provider may use to identify those with the baby blues, postpartum depression, or  psychosis. Often, a screening tool called the [place] Postnatal Depression Scale is used to diagnose depression in the postpartum period.  TREATMENT The baby blues usually goes away on its own in 1-2 weeks. Social support is often all that is needed. You will be encouraged to get adequate sleep and rest. Occasionally, you may be given medicines to help you sleep.  Postpartum depression requires treatment because it can last several months or longer if it is not treated. Treatment may include individual or group therapy, medicine, or both to address any social, physiological, and psychological factors that may play a role in the depression. Regular exercise, a healthy diet, rest, and social support may also be strongly recommended.  Postpartum psychosis is more serious and needs treatment right away. Hospitalization is often needed. HOME CARE INSTRUCTIONS  Get as much rest as you can. Nap when  the baby sleeps.   Exercise regularly. Some women find yoga and walking to be beneficial.   Eat a balanced and nourishing diet.   Do little things that you enjoy. Have a cup of tea, take a bubble bath, read your favorite magazine, or listen to your favorite music.  Avoid alcohol.   Ask for help with household chores, cooking, grocery shopping, or running errands as needed. Do not try to do everything.   Talk to people close to you about how you are feeling. Get support from your partner, family members, friends, or other new moms.  Try to stay positive in how you think. Think about the things you are grateful for.   Do not spend a lot of time alone.   Only take over-the-counter or prescription medicine as directed by your health care provider.  Keep all your postpartum appointments.   Let your health care provider know if you have any concerns.  SEEK MEDICAL CARE IF: You are having a reaction to or problems with your medicine. SEEK IMMEDIATE MEDICAL CARE IF:  You have suicidal  feelings.   You think you may harm the baby or someone else. MAKE SURE YOU:  Understand these instructions.  Will watch your condition.  Will get help right away if you are not doing well or get worse. Document Released: 07/16/2004 Document Revised: 10/17/2013 Document Reviewed: 07/24/2013 [facility] Patient Information 2015 [place], [place]. This information is not intended to replace advice given to you by your health care provider. Make sure you discuss any questions you have with your health care provider.

## 2015-06-21 NOTE — Discharge Summary (Signed)
Cesarean Section Delivery Discharge Summary  Elizabeth Chapman  DOB:    1986-09-25 MRN:    [id number] CSN:    [id number]  Date of admission:                  June 19, 2015  Date of discharge:                   June 21, 2015  Procedures this admission: Primary C/S for Breech Presentation  Date of Delivery: June 19, 2015  Newborn Data:  Live born female  Birth Weight: 7 lb 12.5 oz (3530 g) APGAR: 9, 9  Home with mother. Name: Elizabeth Chapman Circumcision Plan: N/A  History of Present Illness:  [name] is a 29 y.o. female, [phone], who presents at [redacted]w[redacted]d weeks gestation. The patient has been followed at  [facility] division of [facility]   Her pregnancy has been complicated by:  Patient Active Problem List   Diagnosis Date Noted  . Cesarean delivery delivered 06/19/2015  . Breech presentation 06/06/2015  . Body mass index (BMI) of 30.0-30.9 in adult 07/14/2014  . IUFD (intrauterine fetal death) 07/23/2014  . Pregnancy complication, habitual aborter 07/23/2014  . History of prior pregnancy with SGA newborn 07/23/2014    Hospital Course--Scheduled Cesarean: Patient was admitted on Aug 24 for a scheduled primary cesarean delivery.   She was taken to the operating room, where Dr. Carmela Hurt performed a primary LTCS under spinal anesthesia, with delivery of a viable female, with weight and Apgars as listed below. Infant was in good condition and remained at the patient's bedside.  The patient was taken to recovery in good condition.  Patient planned to breast feed.  On post-op day 1, patient was doing well, tolerating a regular diet, with Hgb of 9.7.  Throughout her stay, her physical exam was WNL, her incision was CDI, and her vital signs remained stable.  By post-op day 2, she was up ad lib, tolerating a regular diet, with good pain control with po med.  She was deemed to have received the full benefit of her hospital stay, and  was discharged home in stable condition.  Contraceptive choice was IUD.     Feeding:  breast  Contraception:  IUD  Hemoglobin Results:  CBC Latest Ref Rng 06/20/2015 06/18/2015 07/16/2014  WBC 4.0 - 10.5 K/uL 10.4 12.3(H) 8.2  Hemoglobin 12.0 - 15.0 g/dL 8.4(O) 96.2 95.2  Hematocrit 36.0 - 46.0 % 26.1(L) 37.5 38.6  Platelets 150 - 400 K/uL 163 183 210     Discharge Physical Exam:   General: alert, cooperative and no distress Mood/Affect: Appropriate/Bright Lungs: clear to auscultation, no wheezes, rales or rhonchi, symmetric air entry.  Heart: normal rate and regular rhythm. Breast: breasts appear normal, no suspicious masses, no skin or nipple changes or axillary nodes. Abdomen: + bowel sounds, Moderate Distention, Nontender Incision: no significant drainage on Honeycomb dressing  Uterine Fundus: firm, U/-2 Lochia: appropriate Skin: Warm, Dry. DVT Evaluation: No evidence of DVT seen on physical exam. JP drain: None  Procedures and/or Complications Intrapartum Procedures: cesarean: low cervical, transverse Postpartum Procedures: none Complications-Operative and Postpartum: none   Discharge Information: Diagnoses: Term Pregnancy-delivered, S/p C/S Activity:  pelvic rest Diet:  routine Medications: PNV, Ibuprofen, Colace, Iron and Percocet Condition: stable Instructions: Remove honeycomb dressing in 2 days.  Incision care guidelines: how to clean, when to call, and anticipated healing.  Postpartum Depression, Activity Restrictions, Breastfeeding, Pain Mgmt, Who and  when to call for PP Concerns, Follow Up Appointment, Information Sheet Given PPD&BB, Care after C/S, IUD information  Discharge to: Home  Follow-up Information    Follow up with [facility]. Schedule an appointment as soon as possible for a visit in 5 weeks.   Specialty:  Obstetrics and Gynecology   Why:  Please call if you have any questions or concerns, prior to your  next visit.   Contact information:   [address] [place] [place] [address] [phone]       Marlene Bast, MSN, CNM 06/21/2015 11:58 AM

## 2015-10-27 IMAGING — US US OB TRANSVAGINAL
1 series · 14 of 28 positions shown · non-contrast
Comparison: None.

CLINICAL DATA: Concern for intrauterine fetal demise

EXAM:
OBSTETRIC <14 WK US AND TRANSVAGINAL OB US
TECHNIQUE: Both transabdominal and transvaginal ultrasound examinations were
performed for complete evaluation of the gestation as well as the
maternal uterus, adnexal regions, and pelvic cul-de-sac.
Transvaginal technique was performed to assess early pregnancy.

[Series 1: us ob comp less 14 wks · 32 acquisitions, 14 frames shown]
[im 2/32]
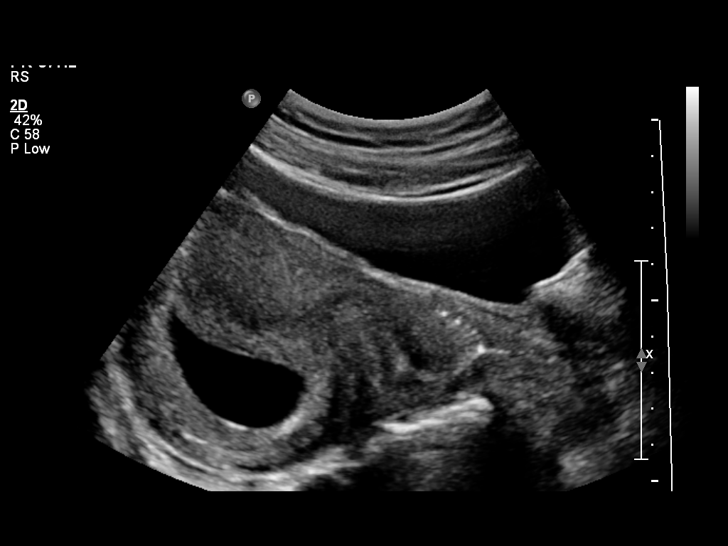
[im 4/32]
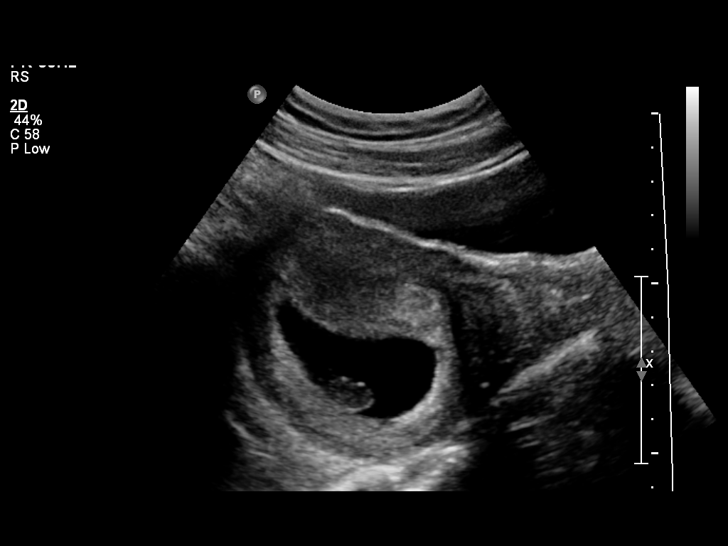
[im 6/32]
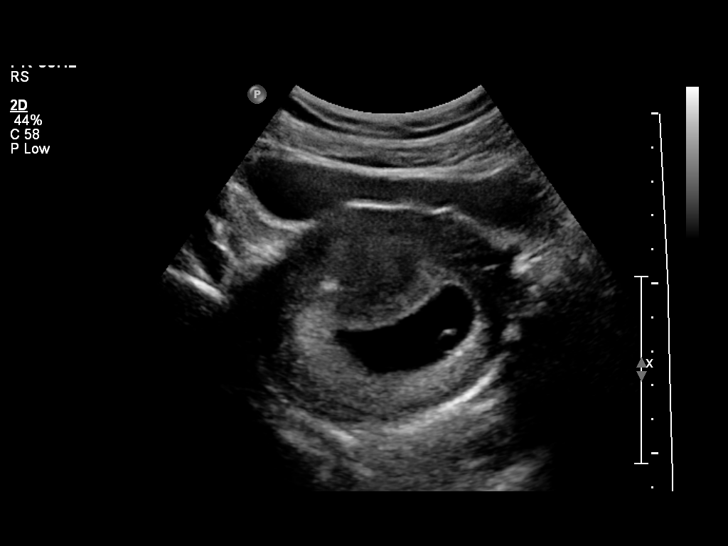
[im 9/32]
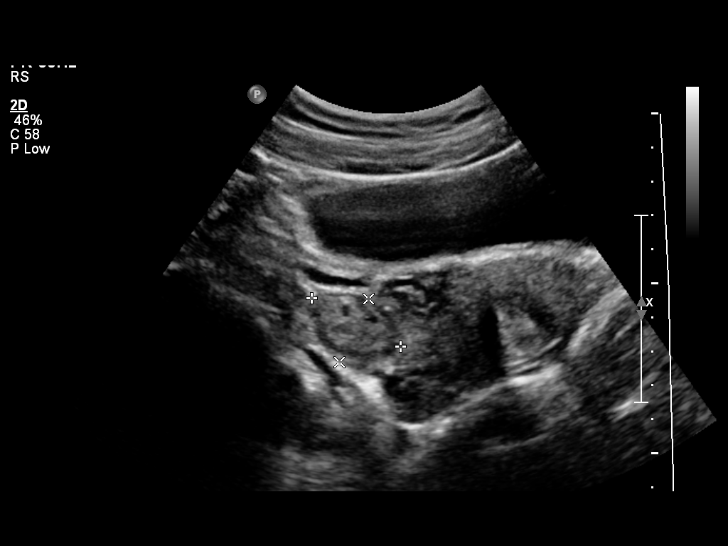
[im 11/32]
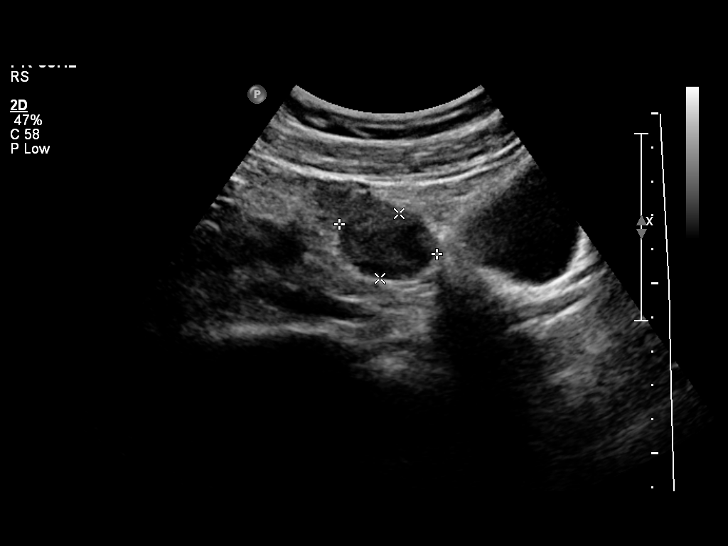
[im 13/32]
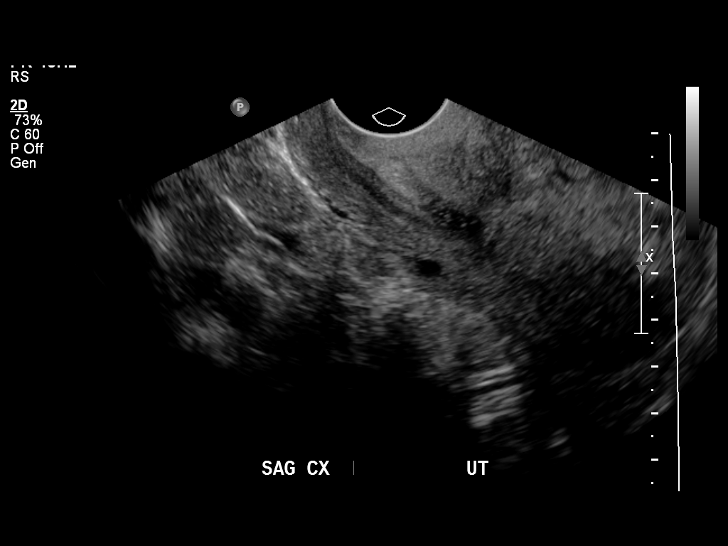
[im 15/32]
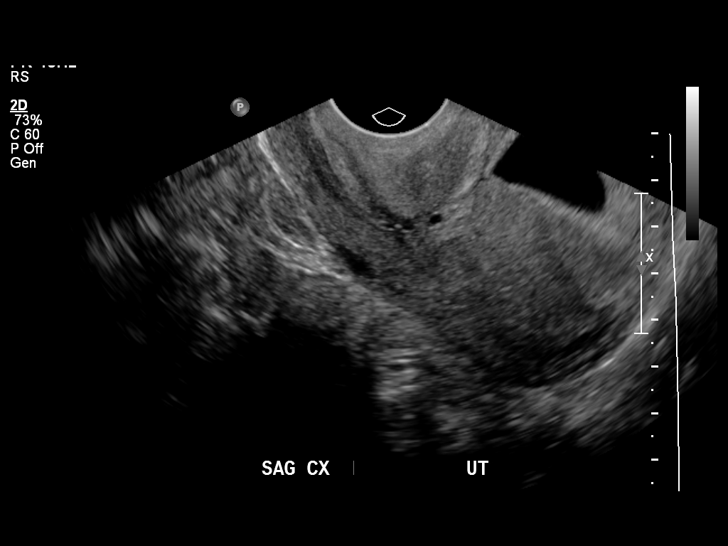
[im 18/32]
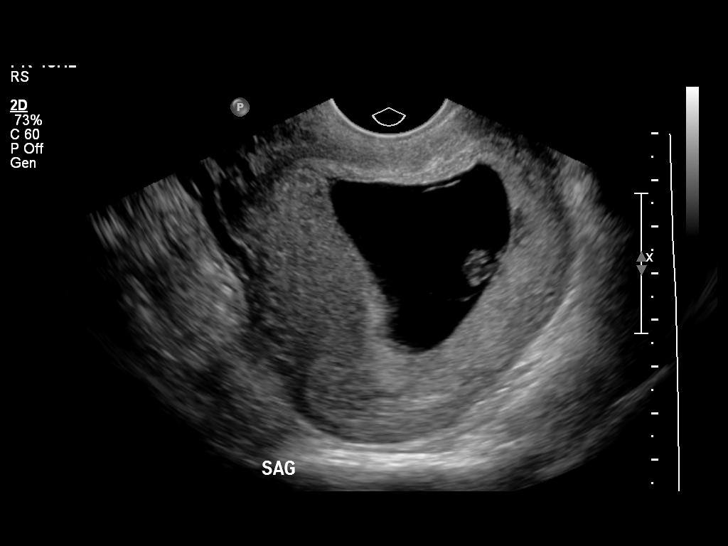
[im 20/32]
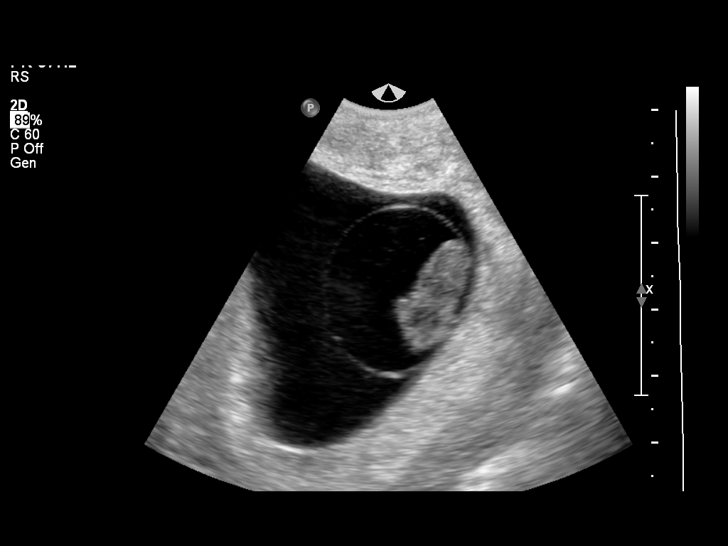
[im 22/32]
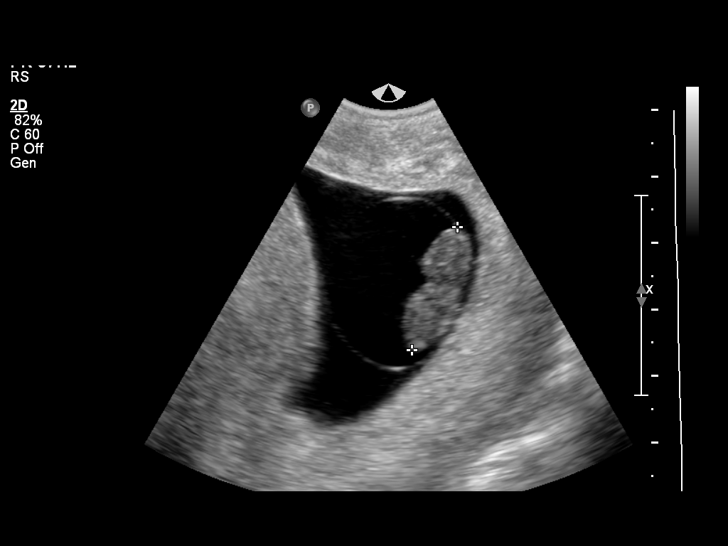
[im 25/32]
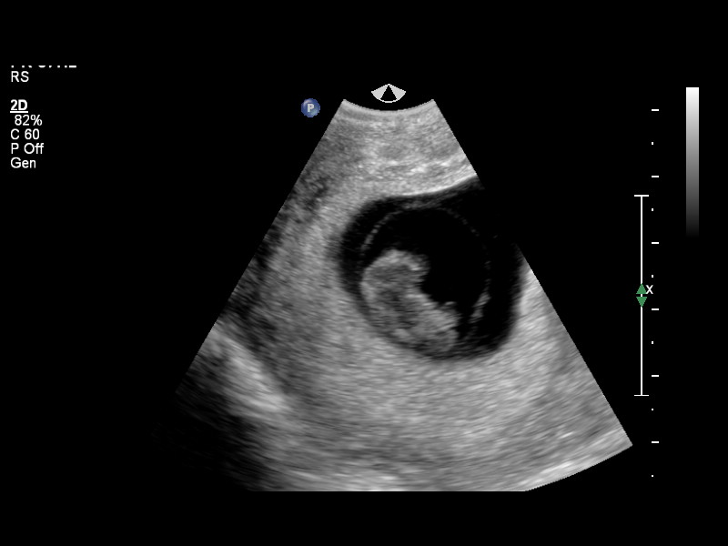
[im 27/32]
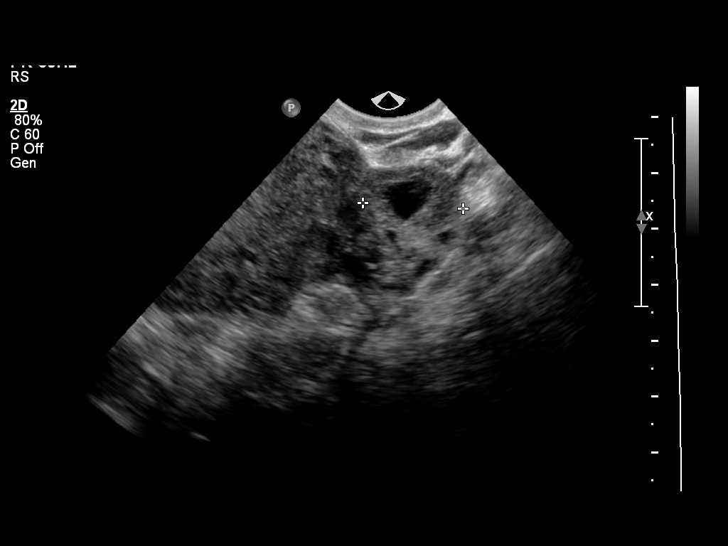
[im 29/32]
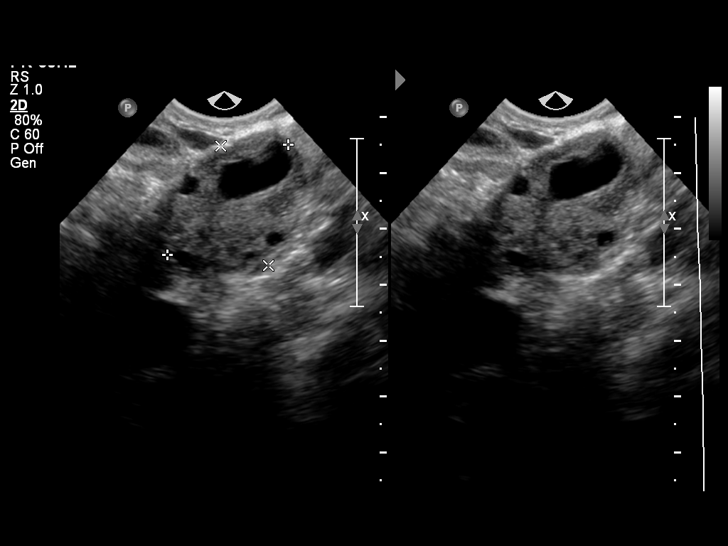
[im 32/32]
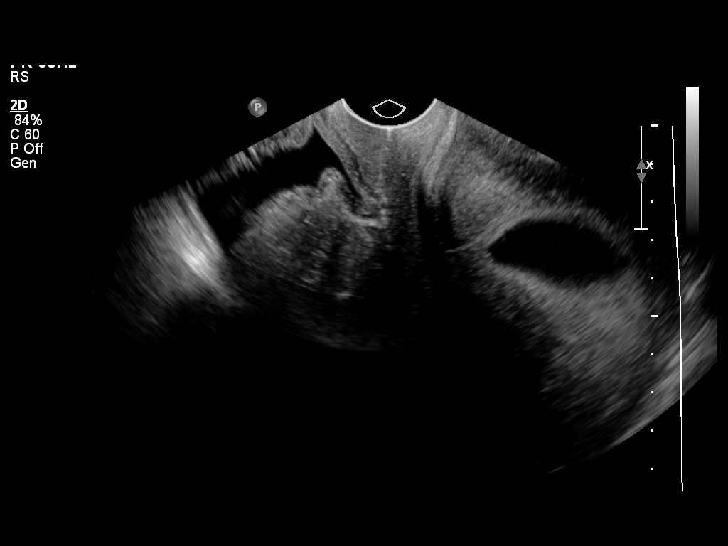

[14 of 28 positions shown; findings below may reference images not displayed]

FINDINGS: Intrauterine gestational sac: Visualized/normal in shape.

Yolk sac:  Visualize

Embryo:  Visualized

Cardiac Activity: Not visualized

CRL:   19  mm   8 w 4 d

Maternal uterus/adnexae: There is no demonstrable subchorionic
hemorrhage. Cervical os is closed. No intrauterine mass. Both
maternal ovaries appear normal. No extrauterine pelvic mass or free
fluid.
IMPRESSION: Findings meet definitive criteria for failed pregnancy. This follows
SRU consensus guidelines: Diagnostic Criteria for Nonviable
Pregnancy Early in the First Trimester. N Engl J Med
3719;[DATE]. No cardiac motion identified.

These results will be called to the ordering clinician or
representative by the Radiologist Assistant, and communication
documented in the PACS or zVision Dashboard.

## 2018-05-26 ENCOUNTER — Encounter (HOSPITAL_BASED_OUTPATIENT_CLINIC_OR_DEPARTMENT_OTHER): Payer: Self-pay | Admitting: *Deleted

## 2018-05-26 ENCOUNTER — Other Ambulatory Visit: Payer: Self-pay

## 2018-05-26 ENCOUNTER — Other Ambulatory Visit: Payer: Self-pay | Admitting: Obstetrics & Gynecology

## 2018-05-26 DIAGNOSIS — R87610 Atypical squamous cells of undetermined significance on cytologic smear of cervix (ASC-US): Secondary | ICD-10-CM

## 2018-05-26 DIAGNOSIS — Z98891 History of uterine scar from previous surgery: Secondary | ICD-10-CM

## 2018-05-26 NOTE — Progress Notes (Signed)
Spoke with patient via telephone for pre op interview. NPO after MN. Pt to arrive at 0830. No am meds. Patient needs T&S morning of surgery.

## 2018-05-26 NOTE — H&P (Signed)
Elizabeth GinsbergKatelyn Chapman is a 32 y.o. female, [id number] at 11 weeks by dates, 9 weeks by fetal size, presenting on 05/27/18 for scheduled D&E due to dx of early fetal demise at her NOB visit yesterday.  Patient had been seen for initial evaluation on 7/17, with US showing viable SIUP at 8 6/7 weeks, c/w LMP dating.  Progesterone level was drawn, results 21.4--per Dr. Su Hiltoberts recommendation, she was begun on vaginal progesterone, due to hx of 3 prior SABs.  She has had no issues during the pregnancy, and denies abdominal pain or bleeding.  Blood type is O+.  She was scheduled for her NOB w/u on 8/1, with FHR unable to be auscultated by doppler.  US showed the fetal demise. Options for management were reviewed, with the patient desiring to proceed as soon as possible with D&E.  She has been NPO since MN.  Patient Active Problem List   Diagnosis Date Noted  . History of cesarean delivery--2916, breech 05/26/2018  . ASCUS of cervix with negative high risk HPV--10/2017 05/26/2018  . Body mass index (BMI) of 30.0-30.9 in adult 07/14/2014  . IUFD (intrauterine fetal death)--[redacted] weeks gestation 07/13/2014  . Pregnancy complication, habitual aborter--SAB x 3 07/13/2014  . History of prior pregnancy with SGA newborn 07/13/2014     OB History    Gravida  6   Para  2   Term  2   Preterm      AB  3   Living  2     SAB  3   TAB      Ectopic      Multiple  0   Live Births  2         2008--SAB at 6 weeks 2009--SAB, early pregnancy 2014--SVB, 39 3/7 weeks, 5+6, female, with [facility] 2015--SAB 10 4/7 weeks 2016--Primary LTCS due to breech, 39 weeks, female, with [facility].  Past Medical History:  Diagnosis Date  . Abnormal Pap smear AGE 87   COLPO; LAST PAP 2011  . Hx of varicella   . Infection    UTI X 1  . Infection 2007   X 1  . Medical history non-contributory   . Vaginal delivery 2014   Past Surgical History:  Procedure Laterality Date  . APPENDECTOMY    . APPENDECTOMY  AGE 8  .  CESAREAN SECTION N/A 06/19/2015   Procedure: CESAREAN SECTION;  Surgeon: Hoover BrownsEma Kulwa, MD;  Location: [facility];  Service: Obstetrics;  Laterality: N/A;  . DILATION AND EVACUATION N/A 07/16/2014   Procedure: DILATATION AND EVACUATION;  Surgeon: Purcell NailsAngela Y Roberts, MD;  Location: [facility];  Service: Gynecology;  Laterality: N/A;  . LYMPH NODES REMOVED FROM NECK  AGE 8  . SUPERFICIAL LYMPH NODE BIOPSY / EXCISION    . TONSILLECTOMY    . TONSILLECTOMY  AGE 23   Family History: family history includes Early death in her father; Epilepsy in her brother; Learning disabilities in her sister.   Social History:  reports that she quit smoking about 11 years ago. Her smoking use included cigarettes. She smoked 0.30 packs per day. She has never used smokeless tobacco. She reports that she drinks alcohol. She reports that she does not use drugs.  She is married to [place], who is involved and supportive.  Patient is Caucasian, a SAHM.  ROS:  Denies abdominal pain, vaginal bleeding, or any other sx.  No Known Allergies    Height 5\' 2"  (1.575 m), weight 88.5 kg (195 lb), unknown if currently breastfeeding.  Chest clear Heart RRR without murmur Abd gravid, NT, FH 9-10 weeks Pelvic: Deferred Ext: WNL  Prenatal labs: ABO, Rh:   O+ Antibody:   Neg Rubella:   Immune RPR:   NR HBsAg:   Neg HIV:   NR GBS:   NA Sickle cell/Hgb electrophoresis:  AA Pap: ASCUS 10/2017 GC:  Negative 05/11/18 Chlamydia:  Negative 05/11/18 Hgb 13.7 at NOB, platelets 239     Assessment/Plan: Plan: Intrauterine fetal demise at 11 weeks by dates, 9 weeks by size. Desires definitive management of SAB. O+ blood type  Admit [facility] pre-op for D&E per Dr. Mora Appl. Routine [facility] pre-op orders Support to patient for loss.  Nigel Bridgeman CNM, [place] 05/26/2018, 10:16 PM

## 2018-05-27 ENCOUNTER — Ambulatory Visit (HOSPITAL_BASED_OUTPATIENT_CLINIC_OR_DEPARTMENT_OTHER)
Admission: RE | Admit: 2018-05-27 | Discharge: 2018-05-27 | Disposition: A | Discharge status: Home / Self Care | Payer: BLUE CROSS/BLUE SHIELD | Source: Ambulatory Visit | Attending: Obstetrics & Gynecology | Admitting: Obstetrics & Gynecology

## 2018-05-27 ENCOUNTER — Ambulatory Visit (HOSPITAL_BASED_OUTPATIENT_CLINIC_OR_DEPARTMENT_OTHER): Payer: BLUE CROSS/BLUE SHIELD | Admitting: Anesthesiology

## 2018-05-27 ENCOUNTER — Encounter (HOSPITAL_BASED_OUTPATIENT_CLINIC_OR_DEPARTMENT_OTHER)
Admission: RE | Disposition: A | Discharge status: Home / Self Care | Payer: Self-pay | Source: Ambulatory Visit | Attending: Obstetrics & Gynecology

## 2018-05-27 ENCOUNTER — Encounter (HOSPITAL_BASED_OUTPATIENT_CLINIC_OR_DEPARTMENT_OTHER): Payer: Self-pay | Admitting: General Practice

## 2018-05-27 DIAGNOSIS — O021 Missed abortion: Secondary | ICD-10-CM | POA: Insufficient documentation

## 2018-05-27 DIAGNOSIS — Z87891 Personal history of nicotine dependence: Secondary | ICD-10-CM | POA: Insufficient documentation

## 2018-05-27 DIAGNOSIS — R87610 Atypical squamous cells of undetermined significance on cytologic smear of cervix (ASC-US): Secondary | ICD-10-CM

## 2018-05-27 DIAGNOSIS — Z98891 History of uterine scar from previous surgery: Secondary | ICD-10-CM

## 2018-05-27 HISTORY — PX: DILATION AND EVACUATION: SHX1459

## 2018-05-27 LAB — TYPE AND SCREEN
ABO/RH(D): O POS
ANTIBODY SCREEN: NEGATIVE

## 2018-05-27 LAB — ABO/RH: ABO/RH(D): O POS

## 2018-05-27 SURGERY — DILATION AND EVACUATION, UTERUS
Anesthesia: Monitor Anesthesia Care | Site: Uterus

## 2018-05-27 MED ORDER — DOXYCYCLINE HYCLATE 100 MG PO CAPS: 100.0000 mg | ORAL_CAPSULE | Freq: Two times a day (BID) | ORAL | 0 refills | Status: AC

## 2018-05-27 MED ORDER — PROMETHAZINE HCL 25 MG/ML IJ SOLN
6.2500 mg | INTRAMUSCULAR | Status: DC | PRN
  Filled 2018-05-27: qty 1

## 2018-05-27 MED ORDER — OXYCODONE HCL 5 MG PO TABS
5.0000 mg | ORAL_TABLET | Freq: Once | ORAL | Status: DC | PRN
  Filled 2018-05-27: qty 1

## 2018-05-27 MED ORDER — KETOROLAC TROMETHAMINE 30 MG/ML IJ SOLN
30.0000 mg | Freq: Once | INTRAMUSCULAR | Status: AC
  Administered 2018-05-27: 30 mg via INTRAVENOUS
  Filled 2018-05-27: qty 1

## 2018-05-27 MED ORDER — METHYLERGONOVINE MALEATE 0.2 MG PO TABS: 0.2000 mg | ORAL_TABLET | Freq: Four times a day (QID) | ORAL | 0 refills | Status: AC

## 2018-05-27 MED ORDER — FENTANYL CITRATE (PF) 100 MCG/2ML IJ SOLN
INTRAMUSCULAR | Status: DC | PRN
  Administered 2018-05-27 (×2): 25 ug via INTRAVENOUS
  Administered 2018-05-27 (×2): 50 ug via INTRAVENOUS
  Administered 2018-05-27 (×2): 25 ug via INTRAVENOUS

## 2018-05-27 MED ORDER — SODIUM CHLORIDE 0.9 % IR SOLN
Status: DC | PRN
  Administered 2018-05-27: 500 mL

## 2018-05-27 MED ORDER — OXYCODONE-ACETAMINOPHEN 5-325 MG PO TABS
1.0000 | ORAL_TABLET | ORAL | Status: DC | PRN
  Filled 2018-05-27: qty 1

## 2018-05-27 MED ORDER — MIDAZOLAM HCL 2 MG/2ML IJ SOLN
INTRAMUSCULAR | Status: AC
  Filled 2018-05-27: qty 2

## 2018-05-27 MED ORDER — PROPOFOL 10 MG/ML IV BOLUS
INTRAVENOUS | Status: AC
  Filled 2018-05-27: qty 20

## 2018-05-27 MED ORDER — FENTANYL CITRATE (PF) 100 MCG/2ML IJ SOLN
25.0000 ug | INTRAMUSCULAR | Status: DC | PRN
  Filled 2018-05-27: qty 1

## 2018-05-27 MED ORDER — PROPOFOL 10 MG/ML IV BOLUS
INTRAVENOUS | Status: AC
  Filled 2018-05-27: qty 40

## 2018-05-27 MED ORDER — MEPERIDINE HCL 25 MG/ML IJ SOLN
6.2500 mg | INTRAMUSCULAR | Status: DC | PRN
  Filled 2018-05-27: qty 1

## 2018-05-27 MED ORDER — MIDAZOLAM HCL 2 MG/2ML IJ SOLN
INTRAMUSCULAR | Status: DC | PRN
  Administered 2018-05-27: 2 mg via INTRAVENOUS

## 2018-05-27 MED ORDER — ONDANSETRON HCL 4 MG PO TABS
4.0000 mg | ORAL_TABLET | Freq: Four times a day (QID) | ORAL | Status: DC | PRN
  Filled 2018-05-27: qty 1

## 2018-05-27 MED ORDER — OXYCODONE-ACETAMINOPHEN 5-325 MG PO TABS
2.0000 | ORAL_TABLET | ORAL | Status: DC | PRN
  Filled 2018-05-27: qty 2

## 2018-05-27 MED ORDER — FENTANYL CITRATE (PF) 100 MCG/2ML IJ SOLN
INTRAMUSCULAR | Status: AC
  Filled 2018-05-27: qty 2

## 2018-05-27 MED ORDER — PROPOFOL 500 MG/50ML IV EMUL
INTRAVENOUS | Status: DC | PRN
  Administered 2018-05-27: 200 ug/kg/min via INTRAVENOUS

## 2018-05-27 MED ORDER — LIDOCAINE 2% (20 MG/ML) 5 ML SYRINGE
INTRAMUSCULAR | Status: DC | PRN
  Administered 2018-05-27: 25 mg via INTRAVENOUS

## 2018-05-27 MED ORDER — ONDANSETRON HCL 4 MG/2ML IJ SOLN
4.0000 mg | Freq: Four times a day (QID) | INTRAMUSCULAR | Status: DC | PRN
  Filled 2018-05-27: qty 2

## 2018-05-27 MED ORDER — KETOROLAC TROMETHAMINE 30 MG/ML IJ SOLN
INTRAMUSCULAR | Status: AC
  Filled 2018-05-27: qty 1

## 2018-05-27 MED ORDER — OXYCODONE HCL 5 MG/5ML PO SOLN
5.0000 mg | Freq: Once | ORAL | Status: DC | PRN
  Filled 2018-05-27: qty 5

## 2018-05-27 MED ORDER — LACTATED RINGERS IV SOLN
INTRAVENOUS | Status: DC
  Filled 2018-05-27: qty 1000

## 2018-05-27 MED ORDER — LIDOCAINE HCL 1 % IJ SOLN
INTRAMUSCULAR | Status: DC | PRN
  Administered 2018-05-27: 10 mL

## 2018-05-27 MED ORDER — HYDROMORPHONE HCL 1 MG/ML IJ SOLN
0.2000 mg | INTRAMUSCULAR | Status: DC | PRN
  Filled 2018-05-27: qty 1

## 2018-05-27 MED ORDER — SIMETHICONE 80 MG PO CHEW
80.0000 mg | CHEWABLE_TABLET | Freq: Four times a day (QID) | ORAL | Status: DC | PRN
  Filled 2018-05-27: qty 1

## 2018-05-27 MED ORDER — HYDROCODONE-ACETAMINOPHEN 5-325 MG PO TABS: 1.0000 | ORAL_TABLET | Freq: Four times a day (QID) | ORAL | 0 refills | Status: DC | PRN

## 2018-05-27 MED ORDER — MENTHOL 3 MG MT LOZG
1.0000 | LOZENGE | OROMUCOSAL | Status: DC | PRN
  Filled 2018-05-27: qty 9

## 2018-05-27 MED ORDER — LACTATED RINGERS IV SOLN
INTRAVENOUS | Status: DC
  Administered 2018-05-27 (×2): via INTRAVENOUS
  Filled 2018-05-27: qty 1000

## 2018-05-27 MED ORDER — IBUPROFEN 800 MG PO TABS: 800.0000 mg | ORAL_TABLET | Freq: Three times a day (TID) | ORAL | 0 refills | Status: DC | PRN

## 2018-05-27 MED FILL — HYDROCODON-APAP 5-325: 5-325 | 5 days supply | Qty: 20 | Fill #0

## 2018-05-27 MED FILL — IBUPROFEN 800 MG TAB: 800 | 10 days supply | Qty: 30 | Fill #0

## 2018-05-27 MED FILL — DOXYCYCLINE HYCLATE 100 MG: 100 | 7 days supply | Qty: 14 | Fill #0

## 2018-05-27 MED FILL — METHYLERGONOVINE MALEATE 0.: 0.2 | 1 days supply | Qty: 4 | Fill #0

## 2018-05-27 SURGICAL SUPPLY — 19 items
CATH ROBINSON RED A/P 16FR (CATHETERS) ×3 IMPLANT
DILATOR CANAL MILEX (MISCELLANEOUS) IMPLANT
FILTER UTR ASPR ASSEMBLY (MISCELLANEOUS) ×3 IMPLANT
GLOVE BIO SURGEON STRL SZ 6.5 (GLOVE) ×2 IMPLANT
GLOVE BIO SURGEONS STRL SZ 6.5 (GLOVE) ×1
GOWN STRL REUS W/TWL LRG LVL3 (GOWN DISPOSABLE) ×3 IMPLANT
HOSE CONNECTING 18IN BERKELEY (TUBING) ×3 IMPLANT
KIT BERKELEY 1ST TRIMESTER 3/8 (MISCELLANEOUS) ×6 IMPLANT
KIT TURNOVER CYSTO (KITS) ×3 IMPLANT
NS IRRIG 500ML POUR BTL (IV SOLUTION) ×3 IMPLANT
PACK VAGINAL MINOR WOMEN LF (CUSTOM PROCEDURE TRAY) ×3 IMPLANT
PAD OB MATERNITY 4.3X12.25 (PERSONAL CARE ITEMS) ×3 IMPLANT
PAD PREP 24X48 CUFFED NSTRL (MISCELLANEOUS) ×3 IMPLANT
SET BERKELEY SUCTION TUBING (SUCTIONS) ×3 IMPLANT
TOWEL OR 17X24 6PK STRL BLUE (TOWEL DISPOSABLE) ×6 IMPLANT
VACURETTE 10 RIGID CVD (CANNULA) IMPLANT
VACURETTE 7MM CVD STRL WRAP (CANNULA) IMPLANT
VACURETTE 8 RIGID CVD (CANNULA) IMPLANT
VACURETTE 9 RIGID CVD (CANNULA) IMPLANT

## 2018-05-27 NOTE — Anesthesia Postprocedure Evaluation (Signed)
Anesthesia Post Note  Patient: Academic librarian  Procedure(s) Performed: Suction DILATATION AND EVACUATION (N/A Uterus)     Patient location during evaluation: PACU Anesthesia Type: MAC Level of consciousness: awake and alert Pain management: pain level controlled Vital Signs Assessment: post-procedure vital signs reviewed and stable Respiratory status: spontaneous breathing, nonlabored ventilation, respiratory function stable and patient connected to nasal cannula oxygen Cardiovascular status: stable and blood pressure returned to baseline Postop Assessment: no apparent nausea or vomiting Anesthetic complications: no    Last Vitals:  Vitals:   05/27/18 1230 05/27/18 1245  BP: 108/72 106/71  Pulse: 61 66  Resp: 13 19  Temp:    SpO2: 100% 100%    Last Pain:  Vitals:   05/27/18 1310  TempSrc:   PainSc: 2                  Effie Berkshire

## 2018-05-27 NOTE — Anesthesia Preprocedure Evaluation (Addendum)
Anesthesia Evaluation  Patient identified by MRN, date of birth, ID band Patient awake    Reviewed: Allergy & Precautions, NPO status , Patient's Chart, lab work & pertinent test results  Airway Mallampati: III  TM Distance: >3 FB Neck ROM: Full    Dental  (+) Teeth Intact, Dental Advisory Given   Pulmonary former smoker,    breath sounds clear to auscultation       Cardiovascular negative cardio ROS   Rhythm:Regular Rate:Normal     Neuro/Psych negative neurological ROS     GI/Hepatic negative GI ROS, Neg liver ROS,   Endo/Other  negative endocrine ROS  Renal/GU negative Renal ROS     Musculoskeletal negative musculoskeletal ROS (+)   Abdominal Normal abdominal exam  (+) + obese,   Peds  Hematology negative hematology ROS (+)   Anesthesia Other Findings   Reproductive/Obstetrics                            Anesthesia Physical Anesthesia Plan  ASA: I  Anesthesia Plan: MAC   Post-op Pain Management:    Induction: Intravenous  PONV Risk Score and Plan: 3 and Ondansetron, Propofol infusion and Midazolam  Airway Management Planned: Simple Face Mask  Additional Equipment: None  Intra-op Plan:   Post-operative Plan:   Informed Consent: I have reviewed the patients History and Physical, chart, labs and discussed the procedure including the risks, benefits and alternatives for the proposed anesthesia with the patient or authorized representative who has indicated his/her understanding and acceptance.   Dental advisory given  Plan Discussed with: CRNA  Anesthesia Plan Comments:        Anesthesia Quick Evaluation

## 2018-05-27 NOTE — Op Note (Signed)
Operative Report  Elizabeth GinsbergKatelyn Chapman female 32 y.o. @DATE @  Procedure:    * DILATATION AND EVACUATION - Choice  Preoperative Diagnosis:  10 week missed ab  Post operative Diagnosis:  same   Indications: The patient with retained products of conception after failed medical management     Surgeon: Essie HartWalda Bjorn Hallas   Assistants: none  Anesthesia: General anesthesia and Local anesthesia 1% buffered lidocaine  ASA Class: 2  Procedure Detail  DILATATION AND EVACUATION  Findings: 10 week size uterus to 7 size post procedure. Moderate amount of retained POC.  Good crie was achieved.  Estimated Blood Loss: 30 mL         Drains: straight cath prior to procedure         Total IV Fluids: 1000 ml  Blood Given: none          Specimens: POC (to Pathology and Genetics)         Implants: none        Complications:  None         Technique:   Patient was placed in dorsal lithotomy position in [employer]. After adequate anesthesia was achieved, the patient was prepped and draped in the usual sterile fashion. The operative Graves speculum was placed in the vagina and the cervix stabilized with a single-tooth tenaculum.  A paracervical block using 1% lidocaine was performed. The cervix was dilated with Hank dilators and the 10 mm curette was used to remove contents of the uterus.  Alternating sharp curettage with a curette and suction curettage was performed until all contents were removed and good crie was achieved.  Silver nitrate was utilized to make anterior cervix hemostatic.  All instruments were removed from the vagina.  The patient tolerated the procedure well.    Disposition: PACU - hemodynamically stable.         Condition: stable  Elizabeth Chapman Elizabeth Chapman

## 2018-05-27 NOTE — Anesthesia Procedure Notes (Signed)
Procedure Name: MAC Date/Time: 05/27/2018 11:10 AM Performed by: Wanita Chamberlain, CRNA Pre-anesthesia Checklist: Patient identified, Timeout performed, Emergency Drugs available, Suction available and Patient being monitored Patient Re-evaluated:Patient Re-evaluated prior to induction Oxygen Delivery Method: Nasal cannula Induction Type: IV induction Placement Confirmation: positive ETCO2,  breath sounds checked- equal and bilateral and CO2 detector

## 2018-05-27 NOTE — Discharge Instructions (Signed)
°  Post Anesthesia Home Care Instructions ° °Activity: °Get plenty of rest for the remainder of the day. A responsible individual must stay with you for 24 hours following the procedure.  °For the next 24 hours, DO NOT: °-Drive a car °-Operate machinery °-Drink alcoholic beverages °-Take any medication unless instructed by your physician °-Make any legal decisions or sign important papers. ° °Meals: °Start with liquid foods such as gelatin or soup. Progress to regular foods as tolerated. Avoid greasy, spicy, heavy foods. If nausea and/or vomiting occur, drink only clear liquids until the nausea and/or vomiting subsides. Call your physician if vomiting continues. ° °Special Instructions/Symptoms: °Your throat may feel dry or sore from the anesthesia or the breathing tube placed in your throat during surgery. If this causes discomfort, gargle with warm salt water. The discomfort should disappear within 24 hours. ° °If you had a scopolamine patch placed behind your ear for the management of post- operative nausea and/or vomiting: ° °1. The medication in the patch is effective for 72 hours, after which it should be removed.  Wrap patch in a tissue and discard in the trash. Wash hands thoroughly with soap and water. °2. You may remove the patch earlier than 72 hours if you experience unpleasant side effects which may include dry mouth, dizziness or visual disturbances. °3. Avoid touching the patch. Wash your hands with soap and water after contact with the patch. °  °DISCHARGE INSTRUCTIONS: D&C / D&E °The following instructions have been prepared to help you care for yourself upon your return home. °  °Personal hygiene: °• Use sanitary pads for vaginal drainage, not tampons. °• Shower the day after your procedure. °• NO tub baths, pools or Jacuzzis for 2-3 weeks. °• Wipe front to back after using the bathroom. ° °Activity and limitations: °• Do NOT drive or operate any equipment for 24 hours. The effects of anesthesia are  still present and drowsiness may result. °• Do NOT rest in bed all day. °• Walking is encouraged. °• Walk up and down stairs slowly. °• You may resume your normal activity in one to two days or as indicated by your physician. ° °Sexual activity: NO intercourse for at least 2 weeks after the procedure, or as indicated by your physician. ° °Diet: Eat a light meal as desired this evening. You may resume your usual diet tomorrow. ° °Return to work: You may resume your work activities in one to two days or as indicated by your doctor. ° °What to expect after your surgery: Expect to have vaginal bleeding/discharge for 2-3 days and spotting for up to 10 days. It is not unusual to have soreness for up to 1-2 weeks. You may have a slight burning sensation when you urinate for the first day. Mild cramps may continue for a couple of days. You may have a regular period in 2-6 weeks. ° °Call your doctor for any of the following: °• Excessive vaginal bleeding, saturating and changing one pad every hour. °• Inability to urinate 6 hours after discharge from hospital. °• Pain not relieved by pain medication. °• Fever of 100.4° F or greater. °• Unusual vaginal discharge or odor. ° ° Call for an appointment:  ° ° °Patient’s signature: ______________________ ° °Nurse’s signature ________________________ ° °Support person's signature_______________________ ° ° ° °

## 2018-05-27 NOTE — Transfer of Care (Signed)
Immediate Anesthesia Transfer of Care Note  Patient: Elizabeth Chapman  Procedure(s) Performed: Suction DILATATION AND EVACUATION (N/A Uterus)  Patient Location: PACU  Anesthesia Type:MAC  Level of Consciousness: awake, alert  and patient cooperative  Airway & Oxygen Therapy: Patient Spontanous Breathing and Patient connected to nasal cannula oxygen  Post-op Assessment: Report given to RN and Post -op Vital signs reviewed and stable  Post vital signs: Reviewed and stable  Last Vitals:  Vitals Value Taken Time  BP    Temp    Pulse 84 05/27/2018 11:59 AM  Resp    SpO2 100 % 05/27/2018 11:59 AM  Vitals shown include unvalidated device data.  Last Pain:  Vitals:   05/27/18 0903  TempSrc:   PainSc: 0-No pain      Patients Stated Pain Goal: 7 (64/35/39 1225)  Complications: No apparent anesthesia complications

## 2018-05-30 ENCOUNTER — Encounter (HOSPITAL_BASED_OUTPATIENT_CLINIC_OR_DEPARTMENT_OTHER): Payer: Self-pay | Admitting: Obstetrics & Gynecology

## 2018-06-13 ENCOUNTER — Encounter (HOSPITAL_COMMUNITY): Payer: Self-pay | Admitting: Obstetrics & Gynecology

## 2019-07-24 ENCOUNTER — Other Ambulatory Visit: Payer: Self-pay

## 2019-07-24 ENCOUNTER — Encounter: Payer: Self-pay | Admitting: *Deleted

## 2019-07-24 ENCOUNTER — Emergency Department (INDEPENDENT_AMBULATORY_CARE_PROVIDER_SITE_OTHER): Payer: BC Managed Care – PPO

## 2019-07-24 ENCOUNTER — Emergency Department (INDEPENDENT_AMBULATORY_CARE_PROVIDER_SITE_OTHER)
Admission: EM | Admit: 2019-07-24 | Discharge: 2019-07-24 | Disposition: A | Discharge status: Home / Self Care | Payer: BC Managed Care – PPO | Source: Home / Self Care

## 2019-07-24 DIAGNOSIS — S99911A Unspecified injury of right ankle, initial encounter: Secondary | ICD-10-CM

## 2019-07-24 DIAGNOSIS — W010XXA Fall on same level from slipping, tripping and stumbling without subsequent striking against object, initial encounter: Secondary | ICD-10-CM

## 2019-07-24 DIAGNOSIS — M79661 Pain in right lower leg: Secondary | ICD-10-CM

## 2019-07-24 DIAGNOSIS — W19XXXA Unspecified fall, initial encounter: Secondary | ICD-10-CM | POA: Diagnosis not present

## 2019-07-24 DIAGNOSIS — M79671 Pain in right foot: Secondary | ICD-10-CM

## 2019-07-24 MED ORDER — ACETAMINOPHEN 325 MG PO TABS
975.0000 mg | ORAL_TABLET | Freq: Once | ORAL | Status: AC
  Administered 2019-07-24: 975 mg via ORAL

## 2019-07-24 MED ORDER — TRAMADOL HCL 50 MG PO TABS: 50.0000 mg | ORAL_TABLET | Freq: Three times a day (TID) | ORAL | 0 refills | Status: DC | PRN

## 2019-07-24 NOTE — ED Triage Notes (Signed)
Pt c/o RT ankle pain and swelling post fall at home at 1730. No OTC meds.

## 2019-07-24 NOTE — Discharge Instructions (Signed)
°  Tramadol is strong pain medication. While taking, do not drink alcohol, drive, or perform any other activities that requires focus while taking these medications.   You may take 500mg  acetaminophen every 4-6 hours or in combination with ibuprofen 400-600mg  every 6-8 hours as needed for pain and inflammation.  Please call to schedule a follow up appointment with Sports Medicine in 1-2 weeks if not improving.

## 2019-07-24 NOTE — ED Provider Notes (Signed)
[facility]    CSN: [id number] Arrival date & time: 07/24/19  1836      History   Chief Complaint Chief Complaint  Patient presents with   Ankle Pain    HPI Elizabeth Chapman is a 33 y.o. female.   HPI Elizabeth Chapman is a 33 y.o. female presenting to UC with c/o sudden onset Right lower leg, ankle and foot pain that occurred around 1730. Pt states she was chasing her dog that had gotten loose in her apartment complex parking lot, she slipped on wet asphalt while wearing sandals, twisting her Right ankle and leg.  Sudden swelling with mild numbness. Unable to bear weight. Pt's neighbor drove her to the UC.     Past Medical History:  Diagnosis Date   Abnormal Pap smear AGE 67   COLPO; LAST PAP 2011   Hx of varicella    Infection    UTI X 1   Infection 2007   X 1   Medical history non-contributory    Vaginal delivery 2014    Patient Active Problem List   Diagnosis Date Noted   History of cesarean delivery--2916, breech 05/26/2018   ASCUS of cervix with negative high risk HPV--10/2017 05/26/2018   Body mass index (BMI) of 30.0-30.9 in adult 07/14/2014   IUFD (intrauterine fetal death)--[redacted] weeks gestation 07/25/14   Pregnancy complication, habitual aborter--SAB x 3 Jul 25, 2014   History of prior pregnancy with SGA newborn July 25, 2014    Past Surgical History:  Procedure Laterality Date   APPENDECTOMY     APPENDECTOMY  AGE 47   CESAREAN SECTION N/A 06/19/2015   Procedure: CESAREAN SECTION;  Surgeon: Hoover Browns, MD;  Location: [facility];  Service: Obstetrics;  Laterality: N/A;   DILATION AND EVACUATION N/A 07/16/2014   Procedure: DILATATION AND EVACUATION;  Surgeon: Purcell Nails, MD;  Location: [facility];  Service: Gynecology;  Laterality: N/A;   DILATION AND EVACUATION N/A 05/27/2018   Procedure: Suction DILATATION AND EVACUATION;  Surgeon: Essie Hart, MD;  Location: [facility];  Service: Gynecology;  Laterality: N/A;   LYMPH NODES  REMOVED FROM NECK  AGE 47   SUPERFICIAL LYMPH NODE BIOPSY / EXCISION     TONSILLECTOMY     TONSILLECTOMY  AGE 89    OB History    Gravida  6   Para  2   Term  2   Preterm      AB  3   Living  2     SAB  3   TAB      Ectopic      Multiple  0   Live Births  2            Home Medications    Prior to Admission medications   Medication Sig Start Date End Date Taking? Authorizing Provider  Prenatal Vit-Fe Fumarate-FA (MULTIVITAMIN-PRENATAL) 27-0.8 MG TABS tablet Take 1 tablet by mouth daily at 12 noon.   Yes [provider]  traMADol (ULTRAM) 50 MG tablet Take 1 tablet (50 mg total) by mouth every 8 (eight) hours as needed. 07/24/19   Lurene Shadow, PA-C    Family History Family History  Problem Relation Age of Onset   Early death Father        WORK ACCIDENT   Learning disabilities Sister    Epilepsy Brother        DUE TO CAR ACCIDENT    Social History Social History   Tobacco Use   Smoking status:  Former Smoker    Packs/day: 0.30    Types: Cigarettes    Quit date: 10/26/2006    Years since quitting: 12.7   Smokeless tobacco: Never Used  Substance Use Topics   Alcohol use: Yes    Comment: OCC   Drug use: No     Allergies   Patient has no known allergies.   Review of Systems Review of Systems  Musculoskeletal: Positive for arthralgias, gait problem (Right leg), joint swelling and myalgias.  Skin: Negative for color change and wound.  Neurological: Positive for numbness. Negative for weakness.     Physical Exam Triage Vital Signs ED Triage Vitals  Enc Vitals Group     BP 07/24/19 1837 133/88     Pulse Rate 07/24/19 1837 84     Resp 07/24/19 1837 18     Temp 07/24/19 1837 98.3 F (36.8 C)     Temp Source 07/24/19 1837 Oral     SpO2 07/24/19 1837 100 %     Weight 07/24/19 1838 193 lb (87.5 kg)     Height 07/24/19 1838 5\' 3"  (1.6 m)     Head Circumference --      Peak Flow --      Pain Score 07/24/19 1837 6      Pain Loc --      Pain Edu? --      Excl. in GC? --    No data found.  Updated Vital Signs BP 133/88 (BP Location: Right Arm)    Pulse 84    Temp 98.3 F (36.8 C) (Oral)    Resp 18    Ht 5\' 3"  (1.6 m)    Wt 193 lb (87.5 kg)    LMP 07/24/2019    SpO2 100%    Breastfeeding Unknown    BMI 34.19 kg/m   Visual Acuity Right Eye Distance:   Left Eye Distance:   Bilateral Distance:    Right Eye Near:   Left Eye Near:    Bilateral Near:     Physical Exam Vitals signs and nursing note reviewed.  Constitutional:      Appearance: She is well-developed.  HENT:     Head: Normocephalic and atraumatic.  Neck:     Musculoskeletal: Normal range of motion.  Cardiovascular:     Rate and Rhythm: Normal rate and regular rhythm.     Pulses:          Dorsalis pedis pulses are 2+ on the right side.       Posterior tibial pulses are 2+ on the right side.  Pulmonary:     Effort: Pulmonary effort is normal.  Musculoskeletal:        General: Swelling and tenderness present.     Comments: Right lower leg: moderate edema to lateral malleolus. Tender. Mild diffuse tenderness to calf, muscle compartment is soft. No obvious edema.  Full ROM Right knee, no edema or tenderness.   Right foot: mild edema, tenderness to dorsal and lateral aspect.   Skin:    General: Skin is warm and dry.     Capillary Refill: Capillary refill takes less than 2 seconds.     Findings: No bruising or erythema.  Neurological:     Mental Status: She is alert and oriented to person, place, and time.     Comments: Subjective decrease sensation to Right lower leg.   Psychiatric:        Behavior: Behavior normal.      UC Treatments / Results  Labs (all labs ordered are listed, but only abnormal results are displayed) Labs Reviewed - No data to display  EKG   Radiology Dg Tibia/fibula Right  Result Date: 07/24/2019 CLINICAL DATA:  Pt with RT tib-fib, ankel and foot pain after fall this pm . Pt twisted foot and has  swollen lateral ankle. Pain runs up her leg. EXAM: RIGHT ANKLE - COMPLETE 3+ VIEW; RIGHT TIBIA AND FIBULA - 2 VIEW; RIGHT FOOT COMPLETE - 3+ VIEW COMPARISON:  None. FINDINGS: Right tibia/fibula: No evidence of fracture or other focal bony lesion. Joint alignment maintained at the knee and ankle. Regional soft tissues are unremarkable. Right ankle: No evidence of fracture or dislocation. Ankle mortise is intact. Tibiotalar joint space is maintained. Prominent right lateral ankle soft tissue swelling. Right foot: No acute fracture or dislocation. No focal bony lesion. No significant arthropathy. The regional soft tissues are unremarkable. IMPRESSION: Prominent right lateral ankle soft tissue swelling. No acute osseous abnormality identified in the right tibia/fibula, ankle or foot. Electronically Signed   By: Audie Pinto M.D.   On: 07/24/2019 19:18   Dg Ankle Complete Right  Result Date: 07/24/2019 CLINICAL DATA:  Pt with RT tib-fib, ankel and foot pain after fall this pm . Pt twisted foot and has swollen lateral ankle. Pain runs up her leg. EXAM: RIGHT ANKLE - COMPLETE 3+ VIEW; RIGHT TIBIA AND FIBULA - 2 VIEW; RIGHT FOOT COMPLETE - 3+ VIEW COMPARISON:  None. FINDINGS: Right tibia/fibula: No evidence of fracture or other focal bony lesion. Joint alignment maintained at the knee and ankle. Regional soft tissues are unremarkable. Right ankle: No evidence of fracture or dislocation. Ankle mortise is intact. Tibiotalar joint space is maintained. Prominent right lateral ankle soft tissue swelling. Right foot: No acute fracture or dislocation. No focal bony lesion. No significant arthropathy. The regional soft tissues are unremarkable. IMPRESSION: Prominent right lateral ankle soft tissue swelling. No acute osseous abnormality identified in the right tibia/fibula, ankle or foot. Electronically Signed   By: Audie Pinto M.D.   On: 07/24/2019 19:18   Dg Foot Complete Right  Result Date: 07/24/2019 CLINICAL  DATA:  Pt with RT tib-fib, ankel and foot pain after fall this pm . Pt twisted foot and has swollen lateral ankle. Pain runs up her leg. EXAM: RIGHT ANKLE - COMPLETE 3+ VIEW; RIGHT TIBIA AND FIBULA - 2 VIEW; RIGHT FOOT COMPLETE - 3+ VIEW COMPARISON:  None. FINDINGS: Right tibia/fibula: No evidence of fracture or other focal bony lesion. Joint alignment maintained at the knee and ankle. Regional soft tissues are unremarkable. Right ankle: No evidence of fracture or dislocation. Ankle mortise is intact. Tibiotalar joint space is maintained. Prominent right lateral ankle soft tissue swelling. Right foot: No acute fracture or dislocation. No focal bony lesion. No significant arthropathy. The regional soft tissues are unremarkable. IMPRESSION: Prominent right lateral ankle soft tissue swelling. No acute osseous abnormality identified in the right tibia/fibula, ankle or foot. Electronically Signed   By: Audie Pinto M.D.   On: 07/24/2019 19:18    Procedures Procedures (including critical care time)  Medications Ordered in UC Medications  acetaminophen (TYLENOL) tablet 975 mg (975 mg Oral Given 07/24/19 1841)    Initial Impression / Assessment and Plan / UC Course  I have reviewed the triage vital signs and the nursing notes.  Pertinent labs & imaging results that were available during my care of the patient were reviewed by me and considered in my medical decision making (see chart for details).  Reviewed imaging with pt, reassured pt of no fractures or dislocations. Will tx as sprain. Ace wrap applied. Pt declined crutches as she plans to borrow from a friend as well as a walking boot. AVS provided.  Final Clinical Impressions(s) / UC Diagnoses   Final diagnoses:  Right ankle injury, initial encounter  Pain in right lower leg  Right foot pain  Fall from slip, trip, or stumble, initial encounter     Discharge Instructions      Tramadol is strong pain medication. While taking, do  not drink alcohol, drive, or perform any other activities that requires focus while taking these medications.   You may take 500mg  acetaminophen every 4-6 hours or in combination with ibuprofen 400-600mg  every 6-8 hours as needed for pain and inflammation.  Please call to schedule a follow up appointment with Sports Medicine in 1-2 weeks if not improving.      ED Prescriptions    Medication Sig Dispense Auth. Provider   traMADol (ULTRAM) 50 MG tablet Take 1 tablet (50 mg total) by mouth every 8 (eight) hours as needed. 8 tablet Lurene ShadowPhelps, Emmery Seiler O, [place]-C     I have reviewed the PDMP during this encounter.   Lurene Shadowhelps, Vinh Sachs O, [place]-C 07/25/19 [phone]

## 2019-10-26 LAB — OB RESULTS CONSOLE GC/CHLAMYDIA
Chlamydia: NEGATIVE
Gonorrhea: NEGATIVE

## 2019-10-26 LAB — OB RESULTS CONSOLE ANTIBODY SCREEN: Antibody Screen: NEGATIVE

## 2019-10-26 LAB — OB RESULTS CONSOLE HIV ANTIBODY (ROUTINE TESTING): HIV: NONREACTIVE

## 2019-10-26 LAB — OB RESULTS CONSOLE RUBELLA ANTIBODY, IGM: Rubella: IMMUNE

## 2019-10-26 LAB — OB RESULTS CONSOLE ABO/RH: RH Type: POSITIVE

## 2019-10-26 LAB — OB RESULTS CONSOLE RPR: RPR: NONREACTIVE

## 2019-10-26 LAB — OB RESULTS CONSOLE HEPATITIS B SURFACE ANTIGEN: Hepatitis B Surface Ag: NEGATIVE

## 2019-10-27 NOTE — L&D Delivery Note (Signed)
Delivery Note   Patient Name: Elizabeth Chapman DOB: Sep 26, 1986 MRN: [id number]  Date of admission: 05/23/2020 Delivering MD: Dale [place]  Date of delivery: 05/23/20 Type of delivery: SVD  Newborn Data: Live born female  Birth Weight:   APGAR: 8, 9  Newborn Delivery   Birth date/time: 05/23/2020 20:45:00 Delivery type: Vaginal, Spontaneous     [name], 34 y.o., @ [redacted]w[redacted]d,  [id number], who was admitted for TOLAC and IOL for NR-FST and decreased fetal movement. Pt progressed with foley bulb and pitocin. I was called to the room when she progressed 2+ station in the second stage of labor.  She pushed for 15/min.  She delivered a viable infant, cephalic and restituted to the LOA position over an intact perineum.  A nuchal cord   was not identified. The baby was placed on maternal abdomen while initial step of NRP were perfmored (Dry, Stimulated, and warmed). Hat placed on baby for thermoregulation. Delayed cord clamping was performed for 2 minutes.  Cord double clamped and cut.  Cord cut by fahter. Apgar scores were 8 and 9. Prophylactic Pitocin was started in the third stage of labor for active management. Placenta took 20 mins to deliver, IO cath used due to right deviated uterus, urine expelled. Moderate frank blood noted, blood clots expelled prior to placenta delivery, TXA and Im methergine given. The placenta delivered spontaneously, shultz, with a 3 vessel cord and was sent to LD, circumvallate placenta noted.  Inspection revealed small perineal tear and left labial laceration. An examination of the vaginal vault and cervix was free from lacerations. The uterus was firm, bleeding stable.  The repair was done under lidocaine.   Placenta and umbilical artery blood gas were not sent.  There were no complications during the procedure.  Mom and baby skin to skin following delivery. Left in stable condition. Plan to continue PO methergine series PP.   Maternal Info: Anesthesia: Lidocaine for  repair Episiotomy: No Lacerations:  small perineal tear with left labial laceration Suture Repair: 4.0 vicryl SH Est. Blood Loss (mL):   Newborn Info:  Baby Sex: female Circumcision: N/A  APGAR (1 MIN): 8   APGAR (5 MINS): 9   APGAR (10 MINS):     Mom to postpartum.  Baby to Couplet care / Skin to Skin.  Dr Sallye Ober updated via Text.   [place], [place], NP-C 05/23/20 9:33 PM

## 2020-05-01 LAB — OB RESULTS CONSOLE GBS: GBS: NEGATIVE

## 2020-05-14 ENCOUNTER — Inpatient Hospital Stay (HOSPITAL_COMMUNITY)
Admission: AD | Admit: 2020-05-14 | Discharge: 2020-05-14 | Disposition: A | Discharge status: Home / Self Care | Payer: BC Managed Care – PPO | Attending: Obstetrics and Gynecology | Admitting: Obstetrics and Gynecology

## 2020-05-14 ENCOUNTER — Other Ambulatory Visit: Payer: Self-pay

## 2020-05-14 ENCOUNTER — Encounter (HOSPITAL_COMMUNITY): Payer: Self-pay | Admitting: Obstetrics and Gynecology

## 2020-05-14 DIAGNOSIS — R030 Elevated blood-pressure reading, without diagnosis of hypertension: Secondary | ICD-10-CM | POA: Insufficient documentation

## 2020-05-14 DIAGNOSIS — O99213 Obesity complicating pregnancy, third trimester: Secondary | ICD-10-CM | POA: Diagnosis not present

## 2020-05-14 DIAGNOSIS — R519 Headache, unspecified: Secondary | ICD-10-CM | POA: Diagnosis not present

## 2020-05-14 DIAGNOSIS — O26893 Other specified pregnancy related conditions, third trimester: Secondary | ICD-10-CM | POA: Diagnosis not present

## 2020-05-14 DIAGNOSIS — O368390 Maternal care for abnormalities of the fetal heart rate or rhythm, unspecified trimester, not applicable or unspecified: Secondary | ICD-10-CM

## 2020-05-14 DIAGNOSIS — Z8759 Personal history of other complications of pregnancy, childbirth and the puerperium: Secondary | ICD-10-CM | POA: Diagnosis not present

## 2020-05-14 DIAGNOSIS — E669 Obesity, unspecified: Secondary | ICD-10-CM | POA: Insufficient documentation

## 2020-05-14 DIAGNOSIS — O0993 Supervision of high risk pregnancy, unspecified, third trimester: Secondary | ICD-10-CM | POA: Insufficient documentation

## 2020-05-14 DIAGNOSIS — Z3A38 38 weeks gestation of pregnancy: Secondary | ICD-10-CM | POA: Diagnosis not present

## 2020-05-14 DIAGNOSIS — O0990 Supervision of high risk pregnancy, unspecified, unspecified trimester: Secondary | ICD-10-CM

## 2020-05-14 LAB — URINALYSIS, ROUTINE W REFLEX MICROSCOPIC
Bilirubin Urine: NEGATIVE
Glucose, UA: NEGATIVE mg/dL
Hgb urine dipstick: NEGATIVE
Ketones, ur: NEGATIVE mg/dL
Leukocytes,Ua: NEGATIVE
Nitrite: NEGATIVE
Protein, ur: NEGATIVE mg/dL
Specific Gravity, Urine: 1.005 (ref 1.005–1.030)
pH: 8 (ref 5.0–8.0)

## 2020-05-14 NOTE — MAU Note (Signed)
.   Elizabeth Chapman is a 34 y.o. at [redacted]w[redacted]d here in MAU reporting: she was sent from office for increase in B/P and non reactive NST in office.  Onset of complaint: today  Pain score: 6 Vitals:   05/14/20 1133  BP: 130/78  Pulse: 99  Resp: 16  Temp: 98.3 F (36.8 C)  SpO2: 99%     FHT:143 Lab orders placed from triage: UA

## 2020-05-14 NOTE — MAU Provider Note (Signed)
History  Elizabeth Chapman is a 34 y.o. at [redacted]w[redacted]d here in MAU reporting: she was sent from office for increase in B/P and non reactive NST in office. Reports mild HA, no visual changes. Thinks HA from lack of food. Denies N/V, no RUQ pain or visual changes. Reports good FM.   Chief Complaint  Patient presents with  . Hypertension  . Non-stress Test   HPI  OB History    Gravida  7   Para  2   Term  2   Preterm      AB  3   Living  2     SAB  3   TAB      Ectopic      Multiple  0   Live Births  2           Past Medical History:  Diagnosis Date  . Abnormal Pap smear AGE 38   COLPO; LAST PAP 2011  . Hx of varicella   . Infection    UTI X 1  . Infection 2007   X 1  . Medical history non-contributory   . Vaginal delivery 2014    Past Surgical History:  Procedure Laterality Date  . APPENDECTOMY    . APPENDECTOMY  AGE 64  . CESAREAN SECTION N/A 06/19/2015   Procedure: CESAREAN SECTION;  Surgeon: Hoover Browns, MD;  Location: [facility];  Service: Obstetrics;  Laterality: N/A;  . DILATION AND EVACUATION N/A 07/16/2014   Procedure: DILATATION AND EVACUATION;  Surgeon: Purcell Nails, MD;  Location: [facility];  Service: Gynecology;  Laterality: N/A;  . DILATION AND EVACUATION N/A 05/27/2018   Procedure: Suction DILATATION AND EVACUATION;  Surgeon: Essie Hart, MD;  Location: [facility] [facility];  Service: Gynecology;  Laterality: N/A;  . LYMPH NODES REMOVED FROM NECK  AGE 64  . SUPERFICIAL LYMPH NODE BIOPSY / EXCISION    . TONSILLECTOMY    . TONSILLECTOMY  AGE 64    Family History  Problem Relation Age of Onset  . Early death Father        WORK ACCIDENT  . Learning disabilities Sister   . Epilepsy Brother        DUE TO CAR ACCIDENT    Social History   Tobacco Use  . Smoking status: Former Smoker    Packs/day: 0.30    Types: Cigarettes    Quit date: 10/26/2006    Years since quitting: 13.5  . Smokeless tobacco: Never Used  Vaping Use  . Vaping Use: Never  used  Substance Use Topics  . Alcohol use: Yes    Comment: OCC  . Drug use: No    Allergies: No Known Allergies  Medications Prior to Admission  Medication Sig Dispense Refill Last Dose  . Prenatal Vit-Fe Fumarate-FA (MULTIVITAMIN-PRENATAL) 27-0.8 MG TABS tablet Take 1 tablet by mouth daily at 12 noon.   05/14/2020 at Unknown time  . traMADol (ULTRAM) 50 MG tablet Take 1 tablet (50 mg total) by mouth every 8 (eight) hours as needed. 8 tablet 0  at not taking    ROS   As noted above Physical Exam     Physical Exam: VS Patient Vitals for the past 24 hrs:  BP Temp Pulse Resp SpO2 Height Weight  05/14/20 1421 110/68 -- -- -- -- -- --  05/14/20 1410 -- -- -- -- 99 % -- --  05/14/20 1405 -- -- -- -- 98 % -- --  05/14/20 1400 110/68 -- (!) 101 --  99 % -- --  05/14/20 1355 -- -- -- -- 98 % -- --  05/14/20 1350 -- -- -- -- 98 % -- --  05/14/20 1346 116/80 -- 97 -- -- -- --  05/14/20 1330 108/71 -- 99 -- 98 % -- --  05/14/20 1300 111/66 -- (!) 107 -- 98 % -- --  05/14/20 1235 -- -- -- -- 97 % -- --  05/14/20 1231 118/68 -- (!) 107 -- -- -- --  05/14/20 1230 -- -- -- -- 97 % -- --  05/14/20 1225 -- -- -- -- 97 % -- --  05/14/20 1220 -- -- -- -- 98 % -- --  05/14/20 1216 114/72 -- (!) 107 -- -- -- --  05/14/20 1215 -- -- -- -- 97 % -- --  05/14/20 1210 -- -- -- -- 97 % -- --  05/14/20 1205 -- -- -- -- 98 % -- --  05/14/20 1133 130/78 98.3 F (36.8 C) 99 16 99 % 5\' 3"  (1.6 m) 105.7 kg   General: NAD Heart: RRR, no murmurs Lungs: CTA b/l  Abd: Soft, NT Ext: no edema Neuro: DTRs normal Other: GU deferred   ED Course  Procedures Serial BP wnl NST and prolonged EFM x 2 hours. Weekly ANFT for BMI > 40  A/P: at [redacted]w[redacted]d  Hx C/S for breech, planning TOLAC  Normotensive in MAU with serial BP checks NST reactive and prolonged monitoring Category 1  Precautions given, [facility] daily  F/U with scheduled appointment on Thursday and PRN  Thursday, CNM,  MSN 05/14/2020, 2:18 PM

## 2020-05-17 ENCOUNTER — Other Ambulatory Visit: Payer: Self-pay | Admitting: Obstetrics and Gynecology

## 2020-05-17 ENCOUNTER — Telehealth (HOSPITAL_COMMUNITY): Payer: Self-pay | Admitting: *Deleted

## 2020-05-17 ENCOUNTER — Encounter (HOSPITAL_COMMUNITY): Payer: Self-pay | Admitting: *Deleted

## 2020-05-17 NOTE — Telephone Encounter (Signed)
Preadmission screen  

## 2020-05-20 ENCOUNTER — Encounter (HOSPITAL_COMMUNITY): Payer: Self-pay | Admitting: *Deleted

## 2020-05-20 ENCOUNTER — Other Ambulatory Visit (HOSPITAL_COMMUNITY): Payer: BC Managed Care – PPO

## 2020-05-23 ENCOUNTER — Inpatient Hospital Stay (HOSPITAL_COMMUNITY)
Admission: AD | Admit: 2020-05-23 | Discharge: 2020-05-25 | DRG: 807 | Disposition: A | Discharge status: Home / Self Care | Payer: BC Managed Care – PPO | Attending: Obstetrics & Gynecology | Admitting: Obstetrics & Gynecology

## 2020-05-23 ENCOUNTER — Encounter (HOSPITAL_COMMUNITY): Payer: Self-pay | Admitting: Obstetrics and Gynecology

## 2020-05-23 DIAGNOSIS — O99214 Obesity complicating childbirth: Secondary | ICD-10-CM | POA: Diagnosis present

## 2020-05-23 DIAGNOSIS — Z20822 Contact with and (suspected) exposure to covid-19: Secondary | ICD-10-CM | POA: Diagnosis present

## 2020-05-23 DIAGNOSIS — O34219 Maternal care for unspecified type scar from previous cesarean delivery: Secondary | ICD-10-CM

## 2020-05-23 DIAGNOSIS — Z87891 Personal history of nicotine dependence: Secondary | ICD-10-CM | POA: Diagnosis not present

## 2020-05-23 DIAGNOSIS — Z98891 History of uterine scar from previous surgery: Secondary | ICD-10-CM

## 2020-05-23 DIAGNOSIS — Z3A39 39 weeks gestation of pregnancy: Secondary | ICD-10-CM | POA: Diagnosis not present

## 2020-05-23 DIAGNOSIS — O368130 Decreased fetal movements, third trimester, not applicable or unspecified: Secondary | ICD-10-CM | POA: Diagnosis present

## 2020-05-23 DIAGNOSIS — Z3490 Encounter for supervision of normal pregnancy, unspecified, unspecified trimester: Secondary | ICD-10-CM

## 2020-05-23 DIAGNOSIS — E669 Obesity, unspecified: Secondary | ICD-10-CM | POA: Diagnosis present

## 2020-05-23 DIAGNOSIS — Z6841 Body Mass Index (BMI) 40.0 and over, adult: Secondary | ICD-10-CM

## 2020-05-23 LAB — TYPE AND SCREEN
ABO/RH(D): O POS
Antibody Screen: NEGATIVE

## 2020-05-23 LAB — CBC
HCT: 35 % — ABNORMAL LOW (ref 36.0–46.0)
Hemoglobin: 12 g/dL (ref 12.0–15.0)
MCH: 30.5 pg (ref 26.0–34.0)
MCHC: 34.3 g/dL (ref 30.0–36.0)
MCV: 89.1 fL (ref 80.0–100.0)
Platelets: 178 10*3/uL (ref 150–400)
RBC: 3.93 MIL/uL (ref 3.87–5.11)
RDW: 14.8 % (ref 11.5–15.5)
WBC: 10.9 10*3/uL — ABNORMAL HIGH (ref 4.0–10.5)
nRBC: 0 % (ref 0.0–0.2)

## 2020-05-23 LAB — SARS CORONAVIRUS 2 BY RT PCR (HOSPITAL ORDER, PERFORMED IN ~~LOC~~ HOSPITAL LAB): SARS Coronavirus 2: NEGATIVE

## 2020-05-23 MED ORDER — DIPHENHYDRAMINE HCL 50 MG/ML IJ SOLN: 12.5000 mg | INTRAMUSCULAR | Status: DC | PRN

## 2020-05-23 MED ORDER — METHYLERGONOVINE MALEATE 0.2 MG/ML IJ SOLN
INTRAMUSCULAR | Status: AC
  Filled 2020-05-23: qty 1

## 2020-05-23 MED ORDER — FENTANYL CITRATE (PF) 100 MCG/2ML IJ SOLN
50.0000 ug | INTRAMUSCULAR | Status: DC | PRN
  Administered 2020-05-23: 100 ug via INTRAVENOUS

## 2020-05-23 MED ORDER — EPHEDRINE 5 MG/ML INJ: 10.0000 mg | INTRAVENOUS | Status: DC | PRN

## 2020-05-23 MED ORDER — FENTANYL-BUPIVACAINE-NACL 0.5-0.125-0.9 MG/250ML-% EP SOLN: 12.0000 mL/h | EPIDURAL | Status: DC | PRN

## 2020-05-23 MED ORDER — TERBUTALINE SULFATE 1 MG/ML IJ SOLN: 0.2500 mg | Freq: Once | INTRAMUSCULAR | Status: DC | PRN

## 2020-05-23 MED ORDER — COCONUT OIL OIL: 1.0000 "application " | TOPICAL_OIL | Status: DC | PRN

## 2020-05-23 MED ORDER — OXYTOCIN-SODIUM CHLORIDE 30-0.9 UT/500ML-% IV SOLN
1.0000 m[IU]/min | INTRAVENOUS | Status: DC
  Administered 2020-05-23: 1 m[IU]/min via INTRAVENOUS

## 2020-05-23 MED ORDER — FENTANYL CITRATE (PF) 100 MCG/2ML IJ SOLN
50.0000 ug | INTRAMUSCULAR | Status: DC | PRN
  Administered 2020-05-23 (×2): 100 ug via INTRAVENOUS
  Filled 2020-05-23 (×3): qty 2

## 2020-05-23 MED ORDER — SENNOSIDES-DOCUSATE SODIUM 8.6-50 MG PO TABS
2.0000 | ORAL_TABLET | ORAL | Status: DC
  Administered 2020-05-24 (×2): 2 via ORAL
  Filled 2020-05-23 (×3): qty 2

## 2020-05-23 MED ORDER — SIMETHICONE 80 MG PO CHEW: 80.0000 mg | CHEWABLE_TABLET | ORAL | Status: DC | PRN

## 2020-05-23 MED ORDER — DIBUCAINE (PERIANAL) 1 % EX OINT: 1.0000 "application " | TOPICAL_OINTMENT | CUTANEOUS | Status: DC | PRN

## 2020-05-23 MED ORDER — TRANEXAMIC ACID-NACL 1000-0.7 MG/100ML-% IV SOLN
INTRAVENOUS | Status: AC
  Administered 2020-05-23: 1000 mg
  Filled 2020-05-23: qty 100

## 2020-05-23 MED ORDER — PHENYLEPHRINE 40 MCG/ML (10ML) SYRINGE FOR IV PUSH (FOR BLOOD PRESSURE SUPPORT): 80.0000 ug | PREFILLED_SYRINGE | INTRAVENOUS | Status: DC | PRN

## 2020-05-23 MED ORDER — METHYLERGONOVINE MALEATE 0.2 MG PO TABS: 0.2000 mg | ORAL_TABLET | ORAL | Status: DC | PRN

## 2020-05-23 MED ORDER — ACETAMINOPHEN 325 MG PO TABS
650.0000 mg | ORAL_TABLET | ORAL | Status: DC | PRN
  Administered 2020-05-24: 650 mg via ORAL
  Filled 2020-05-23: qty 2

## 2020-05-23 MED ORDER — OXYTOCIN-SODIUM CHLORIDE 30-0.9 UT/500ML-% IV SOLN: 2.5000 [IU]/h | INTRAVENOUS | Status: DC

## 2020-05-23 MED ORDER — ONDANSETRON HCL 4 MG PO TABS: 4.0000 mg | ORAL_TABLET | ORAL | Status: DC | PRN

## 2020-05-23 MED ORDER — ZOLPIDEM TARTRATE 5 MG PO TABS: 5.0000 mg | ORAL_TABLET | Freq: Every evening | ORAL | Status: DC | PRN

## 2020-05-23 MED ORDER — LIDOCAINE HCL (PF) 1 % IJ SOLN
30.0000 mL | INTRAMUSCULAR | Status: AC | PRN
  Administered 2020-05-23: 30 mL via SUBCUTANEOUS
  Filled 2020-05-23: qty 30

## 2020-05-23 MED ORDER — ONDANSETRON HCL 4 MG/2ML IJ SOLN
4.0000 mg | Freq: Four times a day (QID) | INTRAMUSCULAR | Status: DC | PRN
  Administered 2020-05-23: 4 mg via INTRAVENOUS
  Filled 2020-05-23: qty 2

## 2020-05-23 MED ORDER — TETANUS-DIPHTH-ACELL PERTUSSIS 5-2.5-18.5 LF-MCG/0.5 IM SUSP: 0.5000 mL | Freq: Once | INTRAMUSCULAR | Status: DC

## 2020-05-23 MED ORDER — WITCH HAZEL-GLYCERIN EX PADS: 1.0000 "application " | MEDICATED_PAD | CUTANEOUS | Status: DC | PRN

## 2020-05-23 MED ORDER — ONDANSETRON HCL 4 MG/2ML IJ SOLN: 4.0000 mg | INTRAMUSCULAR | Status: DC | PRN

## 2020-05-23 MED ORDER — LACTATED RINGERS IV SOLN: 500.0000 mL | Freq: Once | INTRAVENOUS | Status: DC

## 2020-05-23 MED ORDER — TRANEXAMIC ACID 1000 MG/10ML IV SOLN: 1000.0000 mg | Freq: Once | INTRAVENOUS | Status: DC

## 2020-05-23 MED ORDER — LACTATED RINGERS IV SOLN: INTRAVENOUS | Status: DC

## 2020-05-23 MED ORDER — PRENATAL MULTIVITAMIN CH
1.0000 | ORAL_TABLET | Freq: Every day | ORAL | Status: DC
  Administered 2020-05-24 – 2020-05-25 (×2): 1 via ORAL
  Filled 2020-05-23 (×2): qty 1

## 2020-05-23 MED ORDER — TRANEXAMIC ACID-NACL 1000-0.7 MG/100ML-% IV SOLN: 1000.0000 mg | Freq: Once | INTRAVENOUS | Status: DC

## 2020-05-23 MED ORDER — OXYTOCIN-SODIUM CHLORIDE 30-0.9 UT/500ML-% IV SOLN: 1.0000 m[IU]/min | INTRAVENOUS | Status: DC

## 2020-05-23 MED ORDER — ACETAMINOPHEN 325 MG PO TABS: 650.0000 mg | ORAL_TABLET | ORAL | Status: DC | PRN

## 2020-05-23 MED ORDER — IBUPROFEN 600 MG PO TABS
600.0000 mg | ORAL_TABLET | Freq: Four times a day (QID) | ORAL | Status: DC
  Administered 2020-05-24 – 2020-05-25 (×6): 600 mg via ORAL
  Filled 2020-05-23 (×7): qty 1

## 2020-05-23 MED ORDER — OXYTOCIN BOLUS FROM INFUSION
333.0000 mL | Freq: Once | INTRAVENOUS | Status: AC
  Administered 2020-05-23: 333 mL via INTRAVENOUS

## 2020-05-23 MED ORDER — OXYTOCIN-SODIUM CHLORIDE 30-0.9 UT/500ML-% IV SOLN
1.0000 m[IU]/min | INTRAVENOUS | Status: DC
  Filled 2020-05-23: qty 500

## 2020-05-23 MED ORDER — SOD CITRATE-CITRIC ACID 500-334 MG/5ML PO SOLN: 30.0000 mL | ORAL | Status: DC | PRN

## 2020-05-23 MED ORDER — METHYLERGONOVINE MALEATE 0.2 MG/ML IJ SOLN
0.2000 mg | Freq: Once | INTRAMUSCULAR | Status: AC
  Administered 2020-05-23: 0.2 mg via INTRAMUSCULAR

## 2020-05-23 MED ORDER — LACTATED RINGERS IV SOLN: 500.0000 mL | INTRAVENOUS | Status: DC | PRN

## 2020-05-23 MED ORDER — BENZOCAINE-MENTHOL 20-0.5 % EX AERO
1.0000 "application " | INHALATION_SPRAY | CUTANEOUS | Status: DC | PRN
  Filled 2020-05-23: qty 56

## 2020-05-23 MED ORDER — DIPHENHYDRAMINE HCL 25 MG PO CAPS: 25.0000 mg | ORAL_CAPSULE | Freq: Four times a day (QID) | ORAL | Status: DC | PRN

## 2020-05-23 NOTE — H&P (Addendum)
Elizabeth Chapman is a 34 y.o. female, G3P0020, IUP at 39.3 weeks, presenting for TOLAC IOL for Non-reactive Fetal stress test, decreased fetal movement, and cxt every 10 mins. Obesity BMi 40. GBS-. Pt endorse + Fm. Denies vaginal leakage. Denies vaginal bleeding.   Patient Active Problem List   Diagnosis Date Noted  . BMI 40.0-44.9, adult (HCC) 05/23/2020  . History of cesarean delivery--2916, breech 05/26/2018  . ASCUS of cervix with negative high risk HPV--10/2017 05/26/2018  . Body mass index (BMI) of 30.0-30.9 in adult 07/14/2014  . IUFD (intrauterine fetal death)--[redacted] weeks gestation July 20, 2014  . Pregnancy complication, habitual aborter--SAB x 3 07/20/14  . History of prior pregnancy with SGA newborn 07/20/2014   Medications Prior to Admission  Medication Sig Dispense Refill Last Dose  . Prenatal Vit-Fe Fumarate-FA (MULTIVITAMIN-PRENATAL) 27-0.8 MG TABS tablet Take 1 tablet by mouth daily at 12 noon.       Past Medical History:  Diagnosis Date  . Abnormal Pap smear AGE 86   COLPO; LAST PAP 2011  . Hx of varicella   . Infection    UTI X 1  . Infection 2007   X 1  . Medical history non-contributory   . Vaginal delivery 2014  . Vaginal Pap smear, abnormal      No current facility-administered medications on file prior to encounter.   Current Outpatient Medications on File Prior to Encounter  Medication Sig Dispense Refill  . Prenatal Vit-Fe Fumarate-FA (MULTIVITAMIN-PRENATAL) 27-0.8 MG TABS tablet Take 1 tablet by mouth daily at 12 noon.       No Known Allergies  History of present pregnancy: Pt Info/Preference:  Screening/Consents:  Labs:   EDD: Estimated Date of Delivery: 05/27/20  Establised: Patient's last menstrual period was 07/24/2019.  Anatomy Scan: Date: 04/09/2020 Placenta Location: low llying Genetic Screen: Panoroma:Negative AFP:  First Tri: Quad:  Office: [facility]             First PNV: 9.3 wg Blood Type --/--/O POS (07/29 1055)  Language: english Last PNV: 39.3  wg Rhogam    Flu Vaccine:  UTD   Antibody NEG (07/29 1055)  TDaP vaccine UTD   GTT: Early: 4.9 Third Trimester: 111  Feeding Plan: breast BTL: no Rubella: Immune (12/31 0000)  Contraception: Possible vasectomy VBAC: Yes TOLAC consent signed RPR: Nonreactive (12/31 0000)   Circumcision: ???   HBsAg: Negative (12/31 0000)  Pediatrician:  Dr Sharolyn Douglas   HIV: Non-reactive (12/31 0000)   Prenatal Classes: no Additional [place]: See below GBS: Negative/-- (07/07 0000)(For PCN allergy, check sensitivities)       Chlamydia: neg    MFM Referral/Consult:  GC: neg  Support Person: husband   PAP: 2020- WNL  Pain Management: epidural Neonatologist Referral:  Hgb Electrophoresis:  AA  Birth Plan: none   Hgb NOB: 13.9    28W: 11.7  Last [place] for shorten cervix:\  OB History     Gravida  7   Para  2   Term  2   Preterm      AB  3   Living  2      SAB  3   TAB      Ectopic      Multiple  0   Live Births  2          Past Medical History:  Diagnosis Date  . Abnormal Pap smear AGE 86   COLPO; LAST PAP 2011  . Hx of varicella   . Infection  UTI X 1  . Infection 2007   X 1  . Medical history non-contributory   . Vaginal delivery 2014  . Vaginal Pap smear, abnormal    Past Surgical History:  Procedure Laterality Date  . APPENDECTOMY    . APPENDECTOMY  AGE 64  . CESAREAN SECTION N/A 06/19/2015   Procedure: CESAREAN SECTION;  Surgeon: Hoover BrownsEma Yaretzy Olazabal, MD;  Location: [facility];  Service: Obstetrics;  Laterality: N/A;  . DILATION AND EVACUATION N/A 07/16/2014   Procedure: DILATATION AND EVACUATION;  Surgeon: Purcell NailsAngela Y Roberts, MD;  Location: [facility];  Service: Gynecology;  Laterality: N/A;  . DILATION AND EVACUATION N/A 05/27/2018   Procedure: Suction DILATATION AND EVACUATION;  Surgeon: Essie HartPinn, Walda, MD;  [facility];  Service: Gynecology;  Laterality: N/A;  . LYMPH NODES REMOVED FROM NECK  AGE 64  . SUPERFICIAL LYMPH NODE BIOPSY / EXCISION    . TONSILLECTOMY    .  TONSILLECTOMY  AGE 46   Family History: family history includes Early death in her father; Epilepsy in her brother; Learning disabilities in her sister. Social History:  reports that she quit smoking about 13 years ago. Her smoking use included cigarettes. She smoked 0.30 packs per day. She has never used smokeless tobacco. She reports current alcohol use. She reports that she does not use drugs.   Prenatal Transfer Tool  Maternal Diabetes: No Genetic Screening: Normal Maternal Ultrasounds/Referrals: Normal Fetal Ultrasounds or other Referrals:  None Maternal Substance Abuse:  No Significant Maternal Medications:  None Significant Maternal Lab Results: Group B Strep negative  ROS:  Review of Systems  Constitutional: Negative.   HENT: Negative.   Eyes: Negative.   Respiratory: Negative.   Cardiovascular: Negative.   Gastrointestinal: Positive for abdominal pain.  Genitourinary: Negative.   Musculoskeletal: Negative.   Skin: Negative.   Neurological: Negative.   Endo/Heme/Allergies: Negative.   Psychiatric/Behavioral: Negative.      Physical Exam: BP 122/73   Pulse 88   Temp 98.5 F (36.9 C) (Oral)   Resp 18   Ht 5\' 3"  (1.6 m)   Wt (!) 107.1 kg   LMP 07/24/2019   BMI 41.82 kg/m   Physical Exam HENT:     Head: Normocephalic and atraumatic.     Nose: Nose normal.     Mouth/Throat:     Mouth: Mucous membranes are moist.     Pharynx: Oropharynx is clear.  Eyes:     Conjunctiva/sclera: Conjunctivae normal.     Pupils: Pupils are equal, round, and reactive to light.  Cardiovascular:     Rate and Rhythm: Normal rate and regular rhythm.     Pulses: Normal pulses.     Heart sounds: Normal heart sounds.  Pulmonary:     Effort: Pulmonary effort is normal.     Breath sounds: Normal breath sounds.  Abdominal:     General: Bowel sounds are normal.     Palpations: Abdomen is soft.  Genitourinary:    General: Normal vulva.     Rectum: Normal.     Comments: Pelvis  adequate, uterus gravida equal to dates.  Musculoskeletal:        General: Normal range of motion.     Cervical back: Normal range of motion and neck supple.  Skin:    General: Skin is warm and dry.     Capillary Refill: Capillary refill takes less than 2 seconds.  Neurological:     General: No focal deficit present.     Mental Status:  She is alert.  Psychiatric:        Mood and Affect: Mood normal.        Behavior: Behavior normal.      NST: FHR baseline 135 bpm, Variability: moderate, Accelerations:present, Decelerations:  Absent= Cat 1/Reactive UC:   irregular, every 10 minutes SVE:   Dilation: 2 Effacement (%): 60 Station: -2 Exam by:: Greig Castilla, CNM, vertex verified by fetal sutures.  Leopold's: Position vertex, EFW 7.5lbs via leopold's.   Labs: Results for orders placed or performed during the hospital encounter of 05/23/20 (from the past 24 hour(s))  CBC     Status: Abnormal   Collection Time: 05/23/20 10:55 AM  Result Value Ref Range   WBC 10.9 (H) 4.0 - 10.5 K/uL   RBC 3.93 3.87 - 5.11 MIL/uL   Hemoglobin 12.0 12.0 - 15.0 g/dL   HCT 16.1 (L) 36 - 46 %   MCV 89.1 80.0 - 100.0 fL   MCH 30.5 26.0 - 34.0 pg   MCHC 34.3 30.0 - 36.0 g/dL   RDW 09.6 04.5 - 40.9 %   Platelets 178 150 - 400 K/uL   nRBC 0.0 0.0 - 0.2 %  Type and screen     Status: None   Collection Time: 05/23/20 10:55 AM  Result Value Ref Range   ABO/RH(D) O POS    Antibody Screen NEG    Sample Expiration      05/26/2020,2359 Performed at [facility], [address] [place], [place] [address]   SARS Coronavirus 2 by RT PCR (hospital order, performed in [facility] hospital lab) Nasopharyngeal Nasopharyngeal Swab     Status: None   Collection Time: 05/23/20 11:03 AM   Specimen: Nasopharyngeal Swab  Result Value Ref Range   SARS Coronavirus 2 NEGATIVE NEGATIVE    Imaging:  No results found.  MAU Course: Orders Placed This Encounter  Procedures  . SARS Coronavirus 2 by RT PCR  (hospital order, performed in [facility] hospital lab) Nasopharyngeal Nasopharyngeal Swab  . OB RESULT CONSOLE Group B Strep  . CBC  . RPR  . Diet clear liquid Room service appropriate? Yes; Fluid consistency: Thin  . Discontinue Pitocin if tachysystole with non-reassuring FHR is present  . Evaluate fetal heart rate to establish reassuring pattern prior to initiating Cytotec or Pitocin  . If tachysystole WITH reassuring FHR present notify MD / CNM  . Initiate intrauterine resuscitation if tachysystole with non-reasuring FHR is present  . Labor Induction  . May administer Terbutaline 0.25 mg SQ x 1 dose if tachysystole with non-reassuring FHR is presesnt  . Nofify MD/CNM if tachysystole with non-reassuring FHR is present  . Perform a cervical exam prior to initiating Cytotec or Pitocin  . Cervical Exam  . Activity as tolerated  . Fetal monitoring per unit policy  . Notify Physician  . Vitals signs per unit policy  . Order Rapid HIV per protocol if no results on chart  . Discontinue foley prior to vaginal delivery  . Fundal check post delivery every 15 min x 1 hour then every 30 min x 1 hour  . If Rapid HIV test positive or known HIV positive: initiate AZT orders  . Initiate Carrier Fluid Protocol  . Initiate Oral Care Protocol  . Insert foley catheter  . May in and out cath x 2 for inability to void  . Measure blood pressure post delivery every 15 min x 1 hour then every 30 min x 1 hour  . Patient  may have epidural placement upon request  . Evaluate fetal heart rate to establish reassuring pattern prior to initiating Cytotec or Pitocin  . Perform a cervical exam prior to initiating Cytotec or Pitocin  . Discontinue Pitocin if tachysystole with non-reassuring FHR is present  . Nofify MD/CNM if tachysystole with non-reassuring FHR is present  . Initiate intrauterine resuscitation if tachysystole with non-reasuring FHR is present  . If tachysystole WITH reassuring FHR present notify MD  / CNM  . May administer Terbutaline 0.25 mg SQ x 1 dose if tachysystole with non-reassuring FHR is presesnt  . Patient may have epidural upon request  . Labor Induction  . Full code  . Type and screen  . Insert and maintain IV Line  . Admit to Inpatient (patient's expected length of stay will be greater than 2 midnights or inpatient only procedure)   Meds ordered this encounter  Medications  . oxytocin (PITOCIN) IV infusion 30 units in NS 500 mL - Premix    Order Specific Question:   Begin infusion at:    Answer:   2 milli-units/min (2 mL/hr)    Order Specific Question:   Increase infusion by:    Answer:   2 milli-units/min (2 mL/hr)  . terbutaline (BRETHINE) injection 0.25 mg  . lactated ringers infusion  . lactated ringers infusion 500-1,000 mL  . oxytocin (PITOCIN) IV BOLUS FROM BAG  . oxytocin (PITOCIN) IV infusion 30 units in NS 500 mL - Premix  . acetaminophen (TYLENOL) tablet 650 mg  . fentaNYL (SUBLIMAZE) injection 50-100 mcg  . lidocaine (PF) (XYLOCAINE) 1 % injection 30 mL  . ondansetron (ZOFRAN) injection 4 mg  . sodium citrate-citric acid (ORACIT) solution 30 mL  . terbutaline (BRETHINE) injection 0.25 mg  . DISCONTD: oxytocin (PITOCIN) IV infusion 30 units in NS 500 mL - Premix    Order Specific Question:   Begin infusion at:    Answer:   2 milli-units/min (2 mL/hr)    Order Specific Question:   Increase infusion by:    Answer:   2 milli-units/min (2 mL/hr)  . fentaNYL (SUBLIMAZE) injection 50-100 mcg  . oxytocin (PITOCIN) IV infusion 30 units in NS 500 mL - Premix    Order Specific Question:   Begin infusion at:    Answer:   1 milli-unit/min (1 mL/hr)    Order Specific Question:   Increase infusion by:    Answer:   1 milli-unit/min (1 mL/hr)    Assessment/Plan: Elizabeth Chapman is a 34 y.o. female, G3P0020, IUP at 39.3 weeks, presenting for TOLAC IOL for Non-reactive Fetal stress test, decreased fetal movement, and cxt every 10 mins. Obesity BMi 40. GBS-. Pt  endorse + Fm. Denies vaginal leakage. Denies vaginal bleeding.   Reviewed R/B/A about induction, Pitocin, Foley, AROM. Pt verbalized consent for foley and pitocin then AROM when indicated.   FWB: Cat 1 Fetal Tracing.   Plan: Admit to [facility] per consult with Dr Sallye Ober Routine [facility] orders Pain med/epidural prn Placed foley bulb double bubble, 33ml in uterine and in vaginal tolerated extremely well.  Plan to start pitocin  Anticipate labor progression   Dale Nina NP-C, CNM, MSN 05/23/2020, 1:24 PM

## 2020-05-23 NOTE — Plan of Care (Signed)
L&D careplan completed 

## 2020-05-23 NOTE — Progress Notes (Addendum)
Labor Progress Note  Elizabeth Chapman is a 34 y.o. female, G3P0020, IUP at 39.3 weeks, presenting for TOLAC  IOL for Non-reactive Fetal stress test, decreased fetal movement, and cxt every 10 mins. Obesity BMi 40. GBS-. Pt endorse + Fm.  Subjective: Pt resting in NAD, feeling cxt, pace breathing through them, desires all natural but ok to get epidural if she desires. Pt verbalized consent to AROM and RN report foley bulb out.  Patient Active Problem List   Diagnosis Date Noted  . BMI 40.0-44.9, adult (HCC) 05/23/2020  . History of cesarean delivery--2916, breech 05/26/2018  . ASCUS of cervix with negative high risk HPV--10/2017 05/26/2018  . Body mass index (BMI) of 30.0-30.9 in adult 07/14/2014  . IUFD (intrauterine fetal death)--[redacted] weeks gestation 2014/07/16  . Pregnancy complication, habitual aborter--SAB x 3 07/16/2014  . History of prior pregnancy with SGA newborn 2014/07/16   Objective: BP (!) 137/76   Pulse 88   Temp 98.2 F (36.8 C) (Oral)   Resp 18   Ht 5\' 3"  (1.6 m)   Wt (!) 107.1 kg   LMP 07/24/2019   BMI 41.82 kg/m  No intake/output data recorded. Total I/O In: 688.5 [I.V.:688.5] Out: -  NST: FHR baseline 130 bpm, Variability: moderate, Accelerations:present, Decelerations:  Absent= Cat 1/Reactive CTX:  irregular, every 3-5 minutes, lasting 80-110seconds Uterus gravid, soft non tender, moderate to palpate with contractions.  SVE:  Dilation: 5 Effacement (%): 70 Station: -2 Exam by:: [email or web address] CNM Pitocin at 8 mUn/min  Foley bulb out @ 3pm AROM, clear, tolerated well.   Assessment:  Elizabeth Chapman is a 34 y.o. female, G3P0020, IUP at 39.3 weeks, presenting for TOLAC IOL for Non-reactive Fetal stress test, decreased fetal movement, and cxt every 10 mins. Obesity BMi 40. GBS-. Pt endorse + Fm. Pt progressing in latent labor foley, out, pit on 8, and AROM now.  Patient Active Problem List   Diagnosis Date Noted  . BMI 40.0-44.9, adult (HCC) 05/23/2020  . History  of cesarean delivery--2916, breech 05/26/2018  . ASCUS of cervix with negative high risk HPV--10/2017 05/26/2018  . Body mass index (BMI) of 30.0-30.9 in adult 07/14/2014  . IUFD (intrauterine fetal death)--[redacted] weeks gestation 07/16/2014  . Pregnancy complication, habitual aborter--SAB x 3 16-Jul-2014  . History of prior pregnancy with SGA newborn 16-Jul-2014   NICHD: Category 1  Membranes: AROM, clear @1530  on 7/29, no s/s of infectio  Induction:    Foley Bulb: inserted  # 1145 on 7/29, out @ 1500  Pitocin - 8  Pain management:               IV pain management: xPRN             Epidural placement:  PRN  GBS Negative    Plan: Continue labor plan Continuous/intermittent monitoring Rest Ambulate Frequent position changes to facilitate fetal rotation and descent. Will reassess with cervical exam at 2000 or earlier if necessary Continue pitocin per protocol Anticipate labor progression and vaginal delivery (VBAC).    8/29, NP-C, CNM, MSN 05/23/2020. 6:27 PM

## 2020-05-24 ENCOUNTER — Other Ambulatory Visit (HOSPITAL_COMMUNITY): Payer: BC Managed Care – PPO

## 2020-05-24 ENCOUNTER — Encounter (HOSPITAL_COMMUNITY): Payer: Self-pay | Admitting: Obstetrics and Gynecology

## 2020-05-24 LAB — CBC
HCT: 28.9 % — ABNORMAL LOW (ref 36.0–46.0)
Hemoglobin: 9.9 g/dL — ABNORMAL LOW (ref 12.0–15.0)
MCH: 30.5 pg (ref 26.0–34.0)
MCHC: 34.3 g/dL (ref 30.0–36.0)
MCV: 88.9 fL (ref 80.0–100.0)
Platelets: 167 10*3/uL (ref 150–400)
RBC: 3.25 MIL/uL — ABNORMAL LOW (ref 3.87–5.11)
RDW: 14.8 % (ref 11.5–15.5)
WBC: 18.1 10*3/uL — ABNORMAL HIGH (ref 4.0–10.5)
nRBC: 0 % (ref 0.0–0.2)

## 2020-05-24 LAB — RPR: RPR Ser Ql: NONREACTIVE

## 2020-05-24 NOTE — Progress Notes (Signed)
Post Partum Day 1 Successful VBAC  Subjective: no complaints, up ad lib, voiding, tolerating PO and + flatus  Objective: Blood pressure 114/71, pulse 92, temperature 98 F (36.7 C), temperature source Oral, resp. rate 18, height 5\' 3"  (1.6 m), weight (!) 107.1 kg, last menstrual period 07/24/2019, SpO2 100 %, unknown if currently breastfeeding.  Physical Exam:  General: alert, cooperative, appears stated age and no distress Lochia: appropriate Uterine Fundus: firm Incision: NA DVT Evaluation: No evidence of DVT seen on physical exam. Negative Homan's sign. No cords or calf tenderness. No significant calf/ankle edema.  Recent Labs    05/23/20 1055 05/24/20 0505  HGB 12.0 9.9*  HCT 35.0* 28.9*    Assessment/Plan: Plan for discharge tomorrow, Breastfeeding and Lactation consult  Doing well, no complaints.  Activity and diet ad lib   LOS: 1 day   05/26/20 Elizabeth Chapman 05/24/2020, 9:23 AM

## 2020-05-24 NOTE — Progress Notes (Signed)
CSW received consult for Depression during pregnancy.  CSW met with MOB to offer support and complete assessment.    CSW congratulated MOB on the birth of infant. CSW advised MOB of CSW's role and the reason for CSW coming to visit with her. MOB reported that at the start of her pregnancy, she dealt with anxiety as "I have had multiple miscarriages". CSW offered MOB support and advised MOB that situation as such can increase feelings of anxiety. CSW checked in with MOB to see how she has been doing. MOB reported that she has been well and hasn't dealt with anxiety much since the start of her prenancy. MOB reported that she never needed medication to help cope with anxiety and she has never needed a therapist. CSW offered MOB resources for therapy and MOB declined at this time. MOB reported that she has been feeling pretty good since she gave birth and reports no SI, HI or DV to this CSW.  CSW inquired from MOB on her mental health hx and MPOB declined any mental health diagnosis. MOB reported that she has a great support system which include her mom sister, and spouse as well as spouses family. MOB reported that she has all needed items to care for infant including a safe place for infant to sleep once arrived home. MOB reports that infant will be seen at [facility] in [place] for follow up care.   CSW provided education regarding the baby blues period vs. perinatal mood disorders, discussed treatment. CSW recommends self-evaluation during the postpartum time period using the New Mom Checklist from Postpartum Progress and encouraged MOB to contact a medical professional if symptoms are noted at any time.   CSW provided review of Sudden Infant Death Syndrome (SIDS) precautions.   CSW identifies no further need for intervention and no barriers to discharge at this time.   Elizabeth Chapman, MSW, LCSW [facility] [place] [phone]

## 2020-05-25 ENCOUNTER — Other Ambulatory Visit (HOSPITAL_COMMUNITY): Payer: BC Managed Care – PPO

## 2020-05-25 DIAGNOSIS — Z98891 History of uterine scar from previous surgery: Secondary | ICD-10-CM

## 2020-05-25 DIAGNOSIS — Z3490 Encounter for supervision of normal pregnancy, unspecified, unspecified trimester: Secondary | ICD-10-CM

## 2020-05-25 DIAGNOSIS — O34219 Maternal care for unspecified type scar from previous cesarean delivery: Secondary | ICD-10-CM

## 2020-05-25 MED ORDER — BENZOCAINE-MENTHOL 20-0.5 % EX AERO: 1.0000 "application " | INHALATION_SPRAY | CUTANEOUS | 1 refills | Status: DC | PRN

## 2020-05-25 MED ORDER — IBUPROFEN 600 MG PO TABS: 600.0000 mg | ORAL_TABLET | Freq: Four times a day (QID) | ORAL | 0 refills | Status: DC

## 2020-05-25 MED ORDER — ACETAMINOPHEN 325 MG PO TABS: 650.0000 mg | ORAL_TABLET | ORAL | 1 refills | Status: DC | PRN

## 2020-05-25 NOTE — Discharge Summary (Signed)
OB Discharge Summary  Patient Name: Elizabeth Chapman DOB: 07/08/1986 MRN: [id number]  Date of admission: 05/23/2020 Delivering provider: Dale [place]   Admitting diagnosis: BMI 40.0-44.9, adult (HCC) [Z68.41] Intrauterine pregnancy: [redacted]w[redacted]d     Secondary diagnosis: Patient Active Problem List   Diagnosis Date Noted  . Encounter for induction of labor 05/25/2020  . Previous cesarean section 05/25/2020  . VBAC (vaginal birth after Cesarean) 7/29 05/25/2020  . Postpartum care following vaginal delivery 7/29 05/25/2020  . BMI 40.0-44.9, adult (HCC) 05/23/2020   Additional problems: None   Date of discharge: 05/25/2020   Discharge diagnosis: Principal Problem:   Postpartum care following vaginal delivery 7/29 Active Problems:   BMI 40.0-44.9, adult (HCC)   Encounter for induction of labor   Previous cesarean section   VBAC (vaginal birth after Cesarean) 7/29                                                         Post partum procedures: None  Augmentation: AROM, Pitocin and IP Foley Pain control: None  Laceration:Labial  Episiotomy:None  Complications: None  Hospital course:  Induction of Labor With Vaginal Delivery   34 y.o. yo [phone] at [redacted]w[redacted]d was admitted to the hospital 05/23/2020 for induction of labor.  Indication for induction: non-reactive NST.  Patient had an uncomplicated labor course as follows: Membrane Rupture Time/Date: 5:34 PM ,05/23/2020   Delivery Method:VBAC, Spontaneous  Episiotomy: None  Lacerations:  Labial  Details of delivery can be found in separate delivery note.  Patient had a routine postpartum course. Patient is discharged home 05/25/20.  Newborn Data: Birth date:05/23/2020  Birth time:8:45 PM  Gender:Female  Living status:Living  Apgars:8 ,9  Weight:3640 g   Physical exam  Vitals:   05/24/20 1315 05/24/20 2002 05/24/20 2121 05/25/20 0609  BP: 111/66 115/71 108/70 (!) 100/63  Pulse: 96 94 85 84  Resp: 18 16 18 18   Temp: 98.2 F (36.8 C) 97.8  F (36.6 C) 98.2 F (36.8 C) 98.3 F (36.8 C)  TempSrc: Oral Oral Oral Oral  SpO2: 97% 100%  100%  Weight:      Height:       General: alert, cooperative and no distress Lochia: appropriate Uterine Fundus: firm Perineum: repair intact, no edema DVT Evaluation: No evidence of DVT seen on physical exam. Labs: Lab Results  Component Value Date   WBC 18.1 (H) 05/24/2020   HGB 9.9 (L) 05/24/2020   HCT 28.9 (L) 05/24/2020   MCV 88.9 05/24/2020   PLT 167 05/24/2020   CMP Latest Ref Rng & Units 07/29/2013  Glucose 70 - 99 mg/dL 09/28/2013)  BUN 6 - 23 mg/dL 14  Creatinine 709(G - 2.83 mg/dL 6.62  Sodium 9.47 - 654 mEq/L 133(L)  Potassium 3.5 - 5.1 mEq/L 3.9  Chloride 96 - 112 mEq/L 99  CO2 19 - 32 mEq/L 25  Calcium 8.4 - 10.5 mg/dL 9.3  Total Protein 6.0 - 8.3 g/dL 6.0  Total Bilirubin 0.3 - 1.2 mg/dL 650)  Alkaline Phos 39 - 117 U/L 96  AST 0 - 37 U/L 29  ALT 0 - 35 U/L 15   Edinburgh Postnatal Depression Scale Screening Tool 05/25/2020  I have been able to laugh and see the funny side of things. 0  I have looked forward with enjoyment to things. 0  I have blamed myself unnecessarily when things went wrong. 0  I have been anxious or worried for no good reason. 0  I have felt scared or panicky for no good reason. 0  Things have been getting on top of me. 0  I have been so unhappy that I have had difficulty sleeping. 0  I have felt sad or miserable. 0  I have been so unhappy that I have been crying. 0  The thought of harming myself has occurred to me. 0  Edinburgh Postnatal Depression Scale Total 0   Discharge instruction:  per After Visit Summary  After Visit Meds:  Allergies as of 05/25/2020   No Known Allergies     Medication List    TAKE these medications   acetaminophen 325 MG tablet Commonly known as: Tylenol Take 2 tablets (650 mg total) by mouth every 4 (four) hours as needed (for pain scale < 4).   benzocaine-Menthol 20-0.5 % Aero Commonly known as:  DERMOPLAST Apply 1 application topically as needed for irritation (perineal discomfort).   ibuprofen 600 MG tablet Commonly known as: ADVIL Take 1 tablet (600 mg total) by mouth every 6 (six) hours.   multivitamin-prenatal 27-0.8 MG Tabs tablet Take 1 tablet by mouth daily at 12 noon.            Discharge Care Instructions  (From admission, onward)         Start     Ordered   05/25/20 0000  Discharge wound care:       Comments: Sitz baths 2 times /day with warm water x 1 week. May add herbals: 1 ounce dried comfrey leaf* 1 ounce calendula flowers 1 ounce lavender flowers  Supplies can be found online at [employer] sources at [employer], [employer]  1/2 ounce dried uva ursi leaves 1/2 ounce witch hazel blossoms (if you can find them) 1/2 ounce dried sage leaf 1/2 cup sea salt Directions: Bring 2 quarts of water to a boil. Turn off heat, and place 1 ounce (approximately 1 large handful) of the above mixed herbs (not the salt) into the pot. Steep, covered, for 30 minutes.  Strain the liquid well with a fine mesh strainer, and discard the herb material. Add 2 quarts of liquid to the tub, along with the 1/2 cup of salt. This medicinal liquid can also be made into compresses and peri-rinses.   05/25/20 1217         Diet: routine diet  Activity: Advance as tolerated. Pelvic rest for 6 weeks.   Postpartum contraception: IUD Mirena  Newborn Data: Live born female  Birth Weight: 8 lb 0.4 oz (3640 g) APGAR: 8, 9  Newborn Delivery   Birth date/time: 05/23/2020 20:45:00 Delivery type: VBAC, Spontaneous     Named Alyssa Baby Feeding: Breast Disposition:home with mother  Delivery Report:  Review the Delivery Report for details.    Follow up:  Follow-up Information    [facility]. Schedule an appointment as soon as possible for a visit in 6 week(s).   Specialty: Obstetrics and Gynecology Why: Please make an appointment  for 6 week postpartum visit.  Contact information: [address] [place] [address] [phone]             Signed: Clancy Gourd, MSN 05/25/2020, 12:19 PM

## 2020-05-27 ENCOUNTER — Other Ambulatory Visit (HOSPITAL_COMMUNITY): Payer: BC Managed Care – PPO

## 2020-05-28 ENCOUNTER — Inpatient Hospital Stay (HOSPITAL_COMMUNITY): Payer: BC Managed Care – PPO

## 2020-11-06 IMAGING — DX DG FOOT COMPLETE 3+V*R*
3 series · 3 of 3 positions shown · non-contrast
Comparison: None.

CLINICAL DATA: Pt with RT tib-fib, ankel and foot pain after fall
this pm . Pt twisted foot and has swollen lateral ankle. Pain runs
up her leg.

EXAM:
RIGHT ANKLE - COMPLETE 3+ VIEW; RIGHT TIBIA AND FIBULA - 2 VIEW;
RIGHT FOOT COMPLETE - 3+ VIEW

[foot ap]
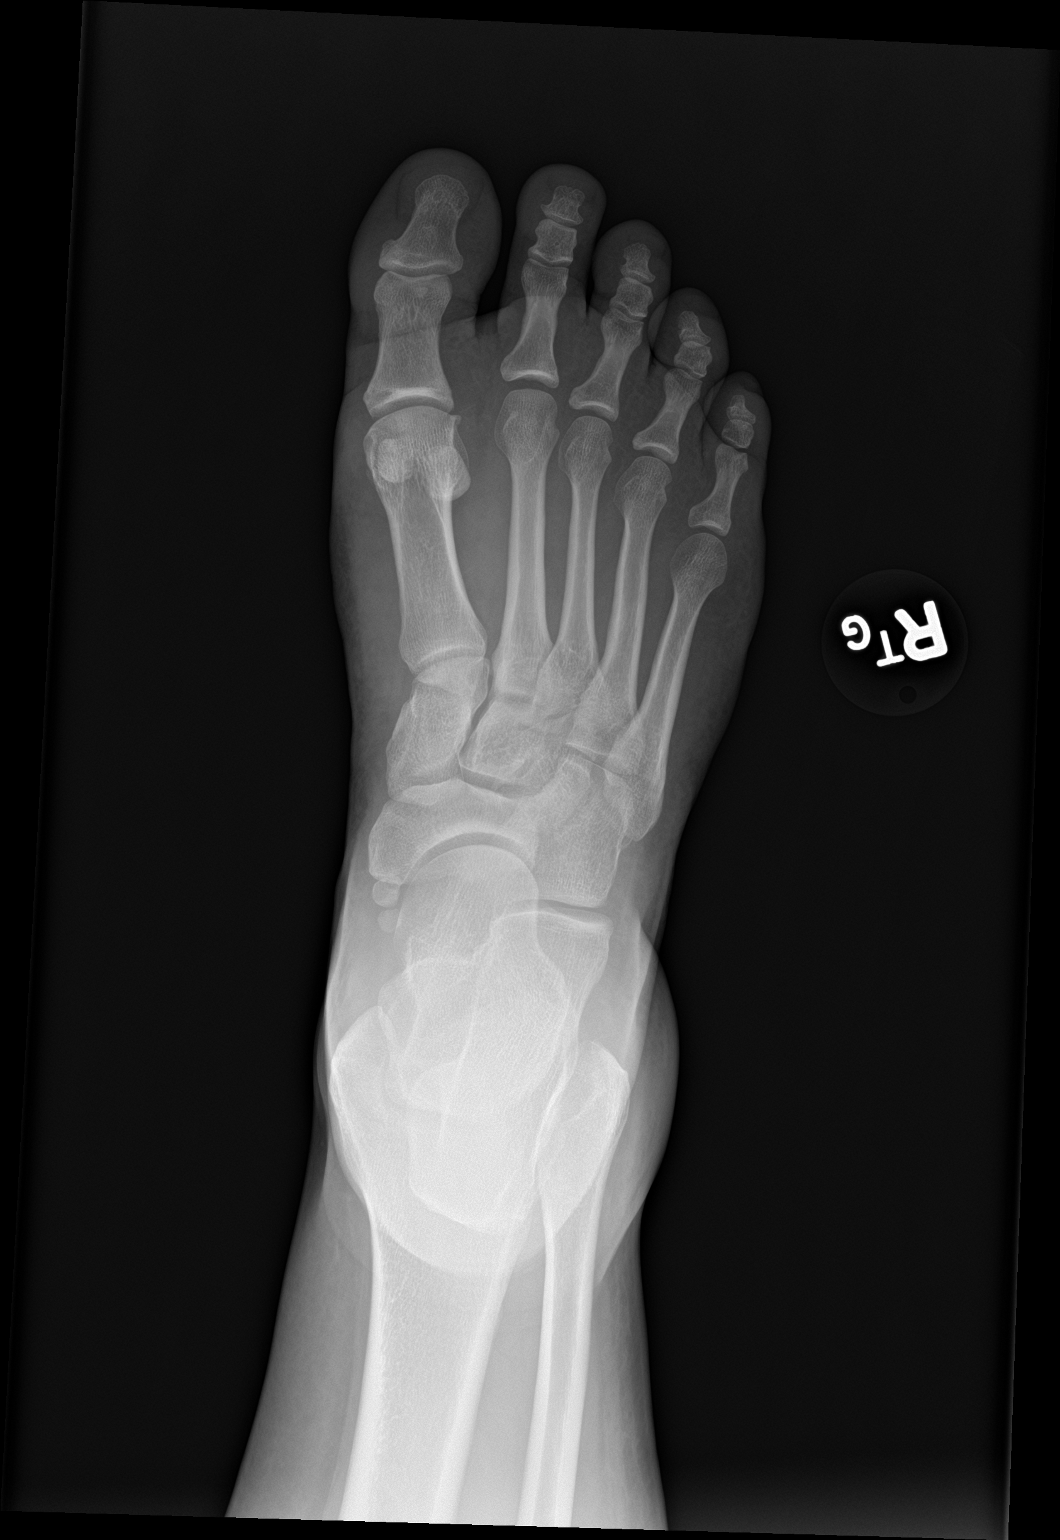

[foot obl]
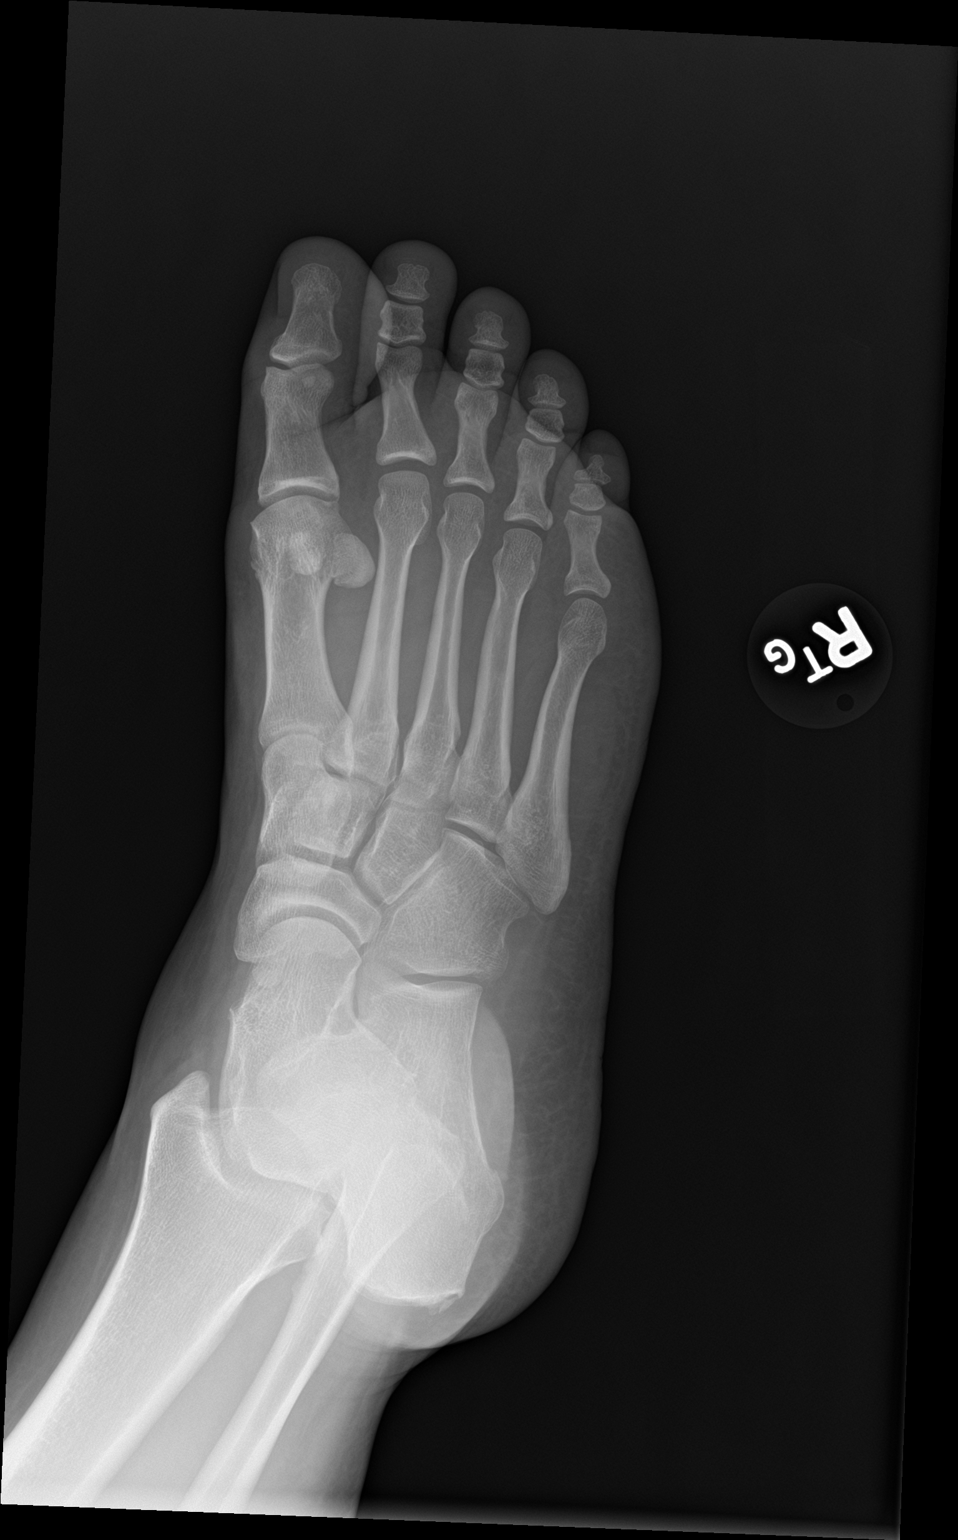

[foot lat]
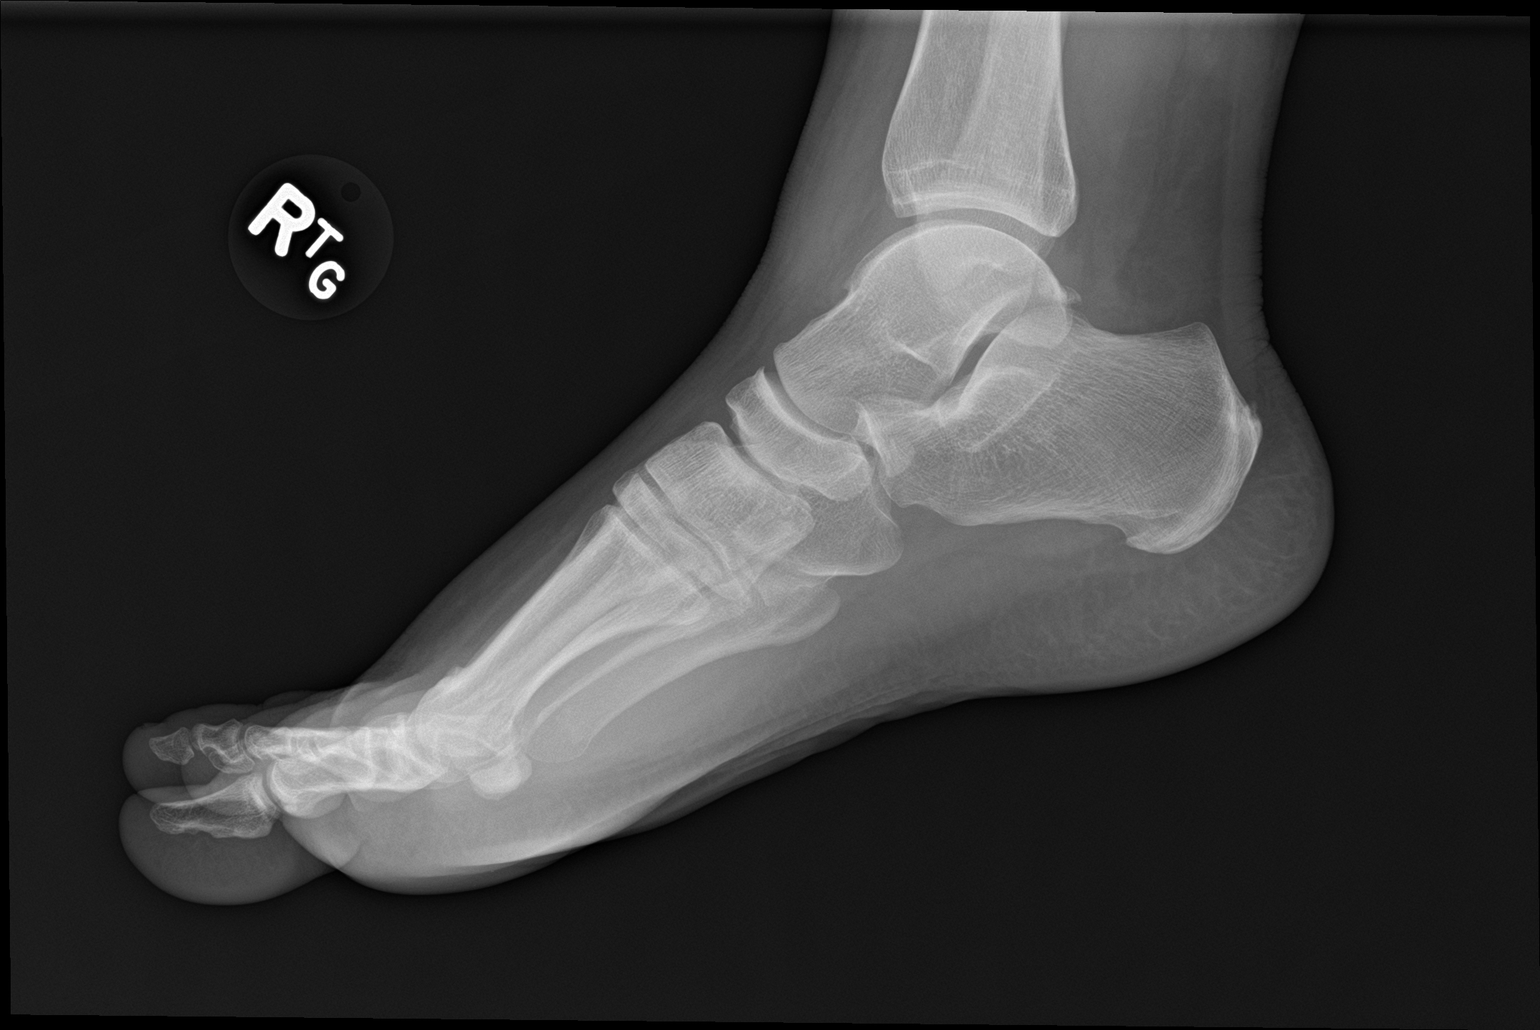

[3 of 3 positions shown; findings below may reference images not displayed]

FINDINGS: Right tibia/fibula: No evidence of fracture or other focal bony
lesion. Joint alignment maintained at the knee and ankle. Regional
soft tissues are unremarkable.

Right ankle: No evidence of fracture or dislocation. Ankle mortise
is intact. Tibiotalar joint space is maintained. Prominent right
lateral ankle soft tissue swelling.

Right foot: No acute fracture or dislocation. No focal bony lesion.
No significant arthropathy. The regional soft tissues are
unremarkable.
IMPRESSION: Prominent right lateral ankle soft tissue swelling. No acute osseous
abnormality identified in the right tibia/fibula, ankle or foot.

## 2020-11-06 IMAGING — DX DG TIBIA/FIBULA 2V*R*
2 series · 2 of 2 positions shown · non-contrast
Comparison: None.

CLINICAL DATA: Pt with RT tib-fib, ankel and foot pain after fall
this pm . Pt twisted foot and has swollen lateral ankle. Pain runs
up her leg.

EXAM:
RIGHT ANKLE - COMPLETE 3+ VIEW; RIGHT TIBIA AND FIBULA - 2 VIEW;
RIGHT FOOT COMPLETE - 3+ VIEW

[tibia ap]
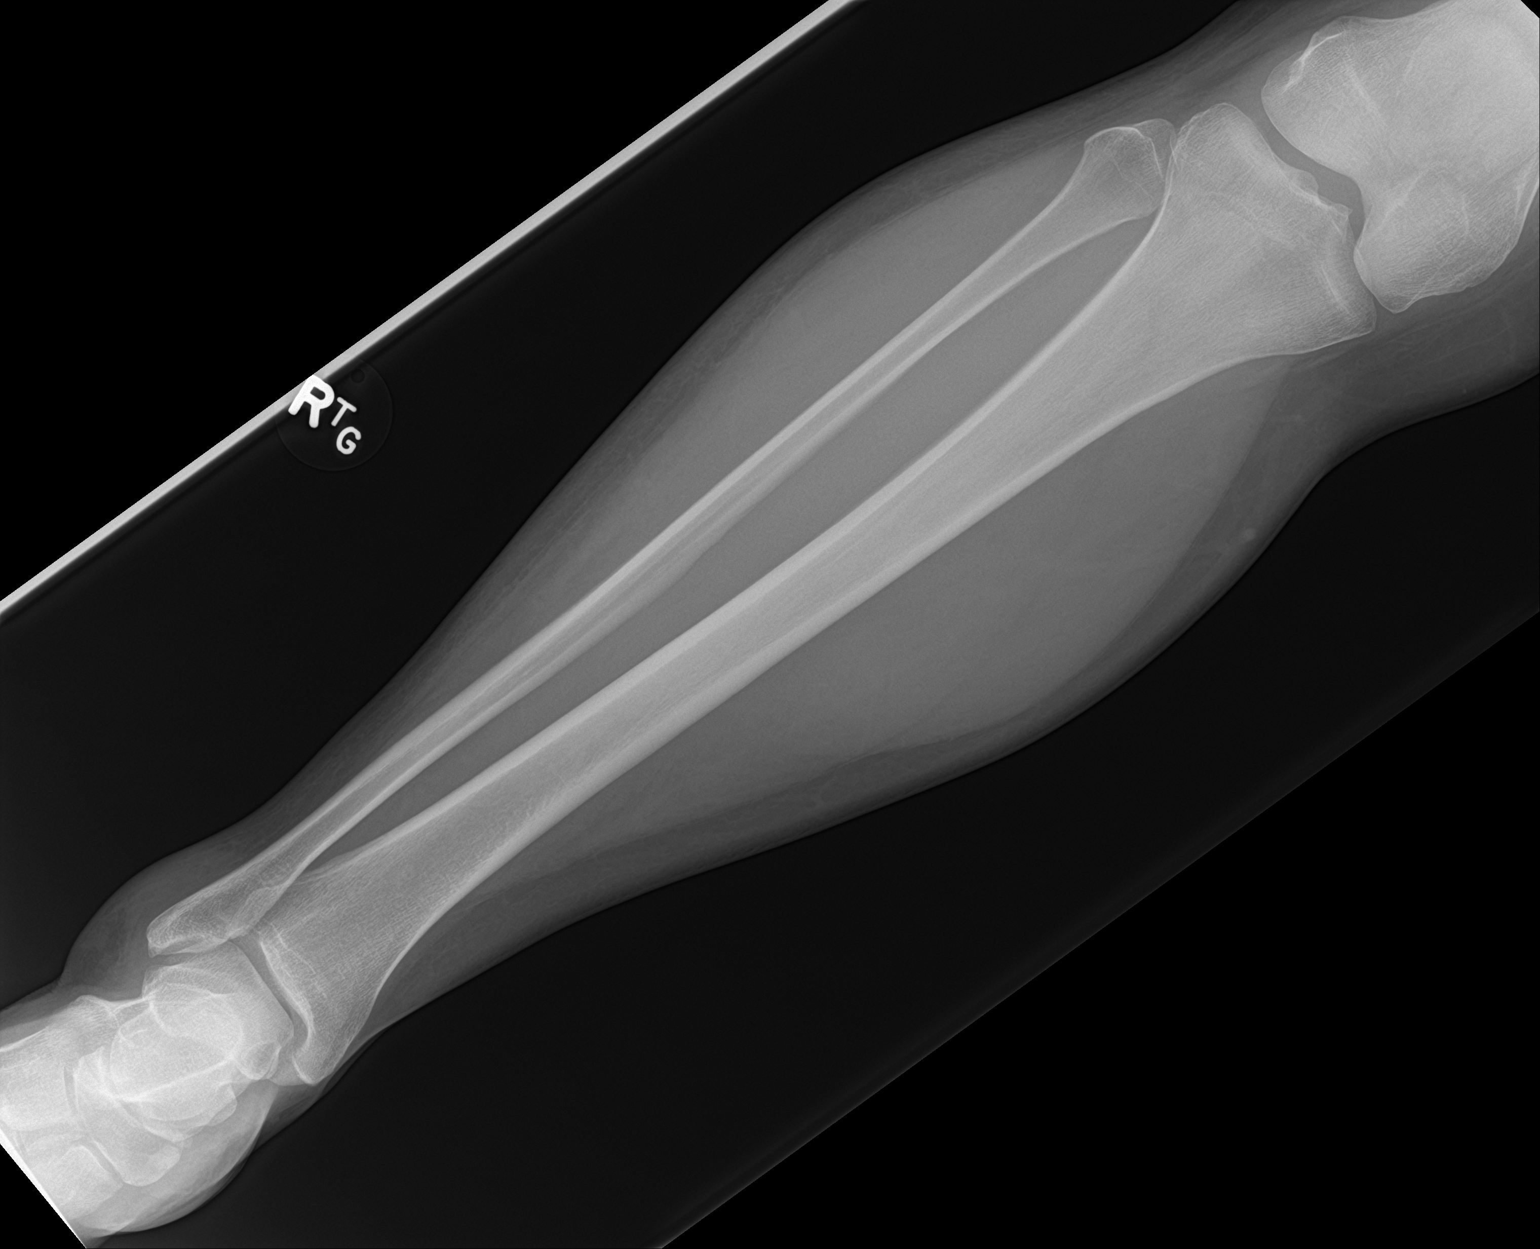

[tibia lat]
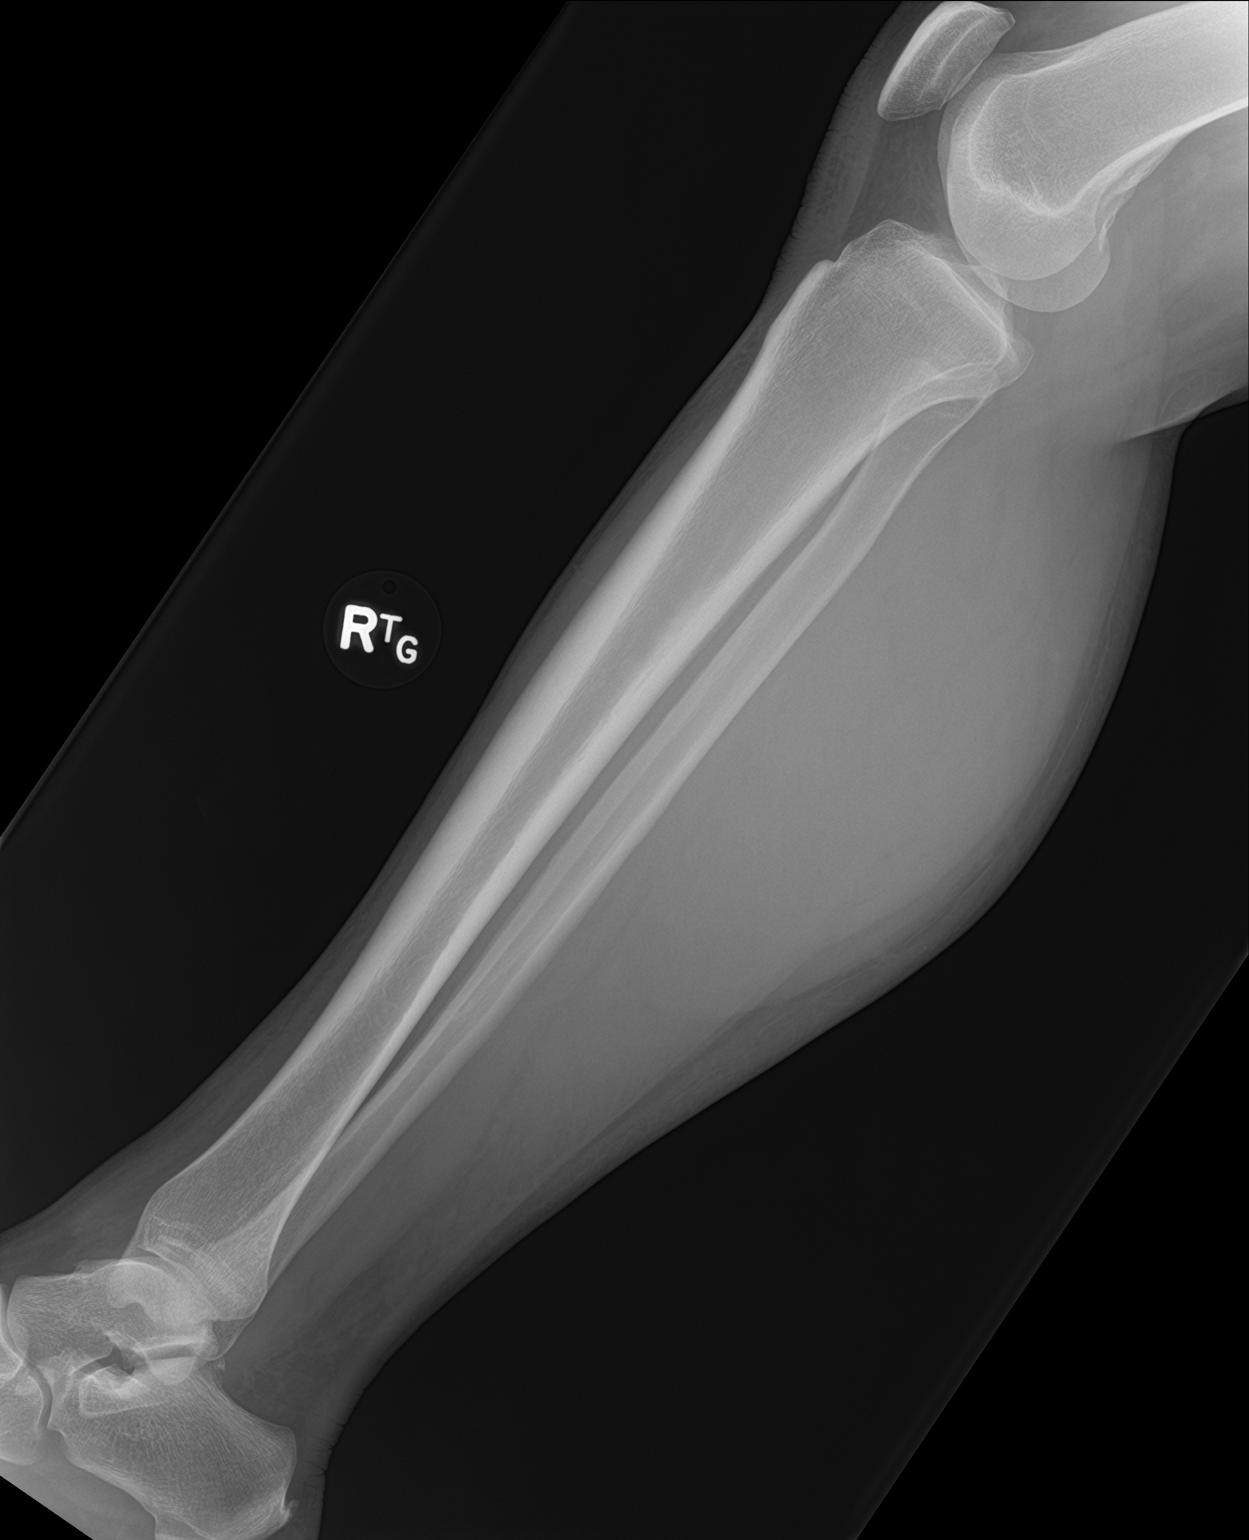

[2 of 2 positions shown; findings below may reference images not displayed]

FINDINGS: Right tibia/fibula: No evidence of fracture or other focal bony
lesion. Joint alignment maintained at the knee and ankle. Regional
soft tissues are unremarkable.

Right ankle: No evidence of fracture or dislocation. Ankle mortise
is intact. Tibiotalar joint space is maintained. Prominent right
lateral ankle soft tissue swelling.

Right foot: No acute fracture or dislocation. No focal bony lesion.
No significant arthropathy. The regional soft tissues are
unremarkable.
IMPRESSION: Prominent right lateral ankle soft tissue swelling. No acute osseous
abnormality identified in the right tibia/fibula, ankle or foot.

## 2020-11-06 IMAGING — DX DG ANKLE COMPLETE 3+V*R*
3 series · 3 of 3 positions shown · non-contrast
Comparison: None.

CLINICAL DATA: Pt with RT tib-fib, ankel and foot pain after fall
this pm . Pt twisted foot and has swollen lateral ankle. Pain runs
up her leg.

EXAM:
RIGHT ANKLE - COMPLETE 3+ VIEW; RIGHT TIBIA AND FIBULA - 2 VIEW;
RIGHT FOOT COMPLETE - 3+ VIEW

[ankle ap]
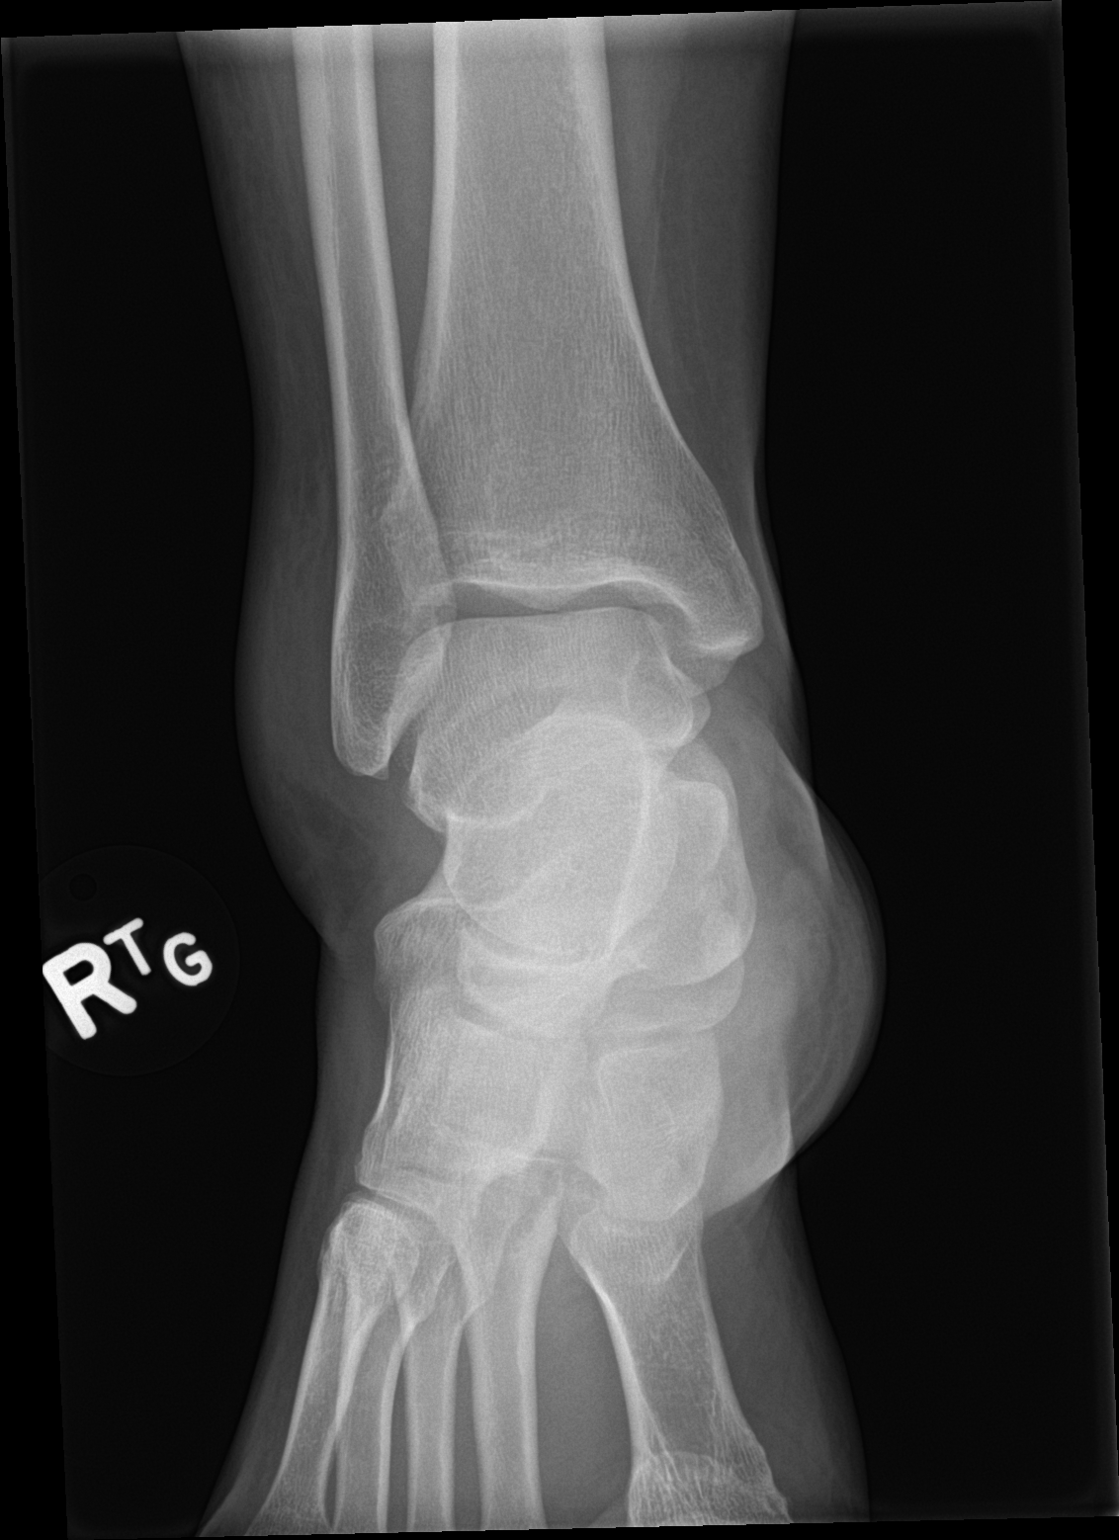

[ankle obl]
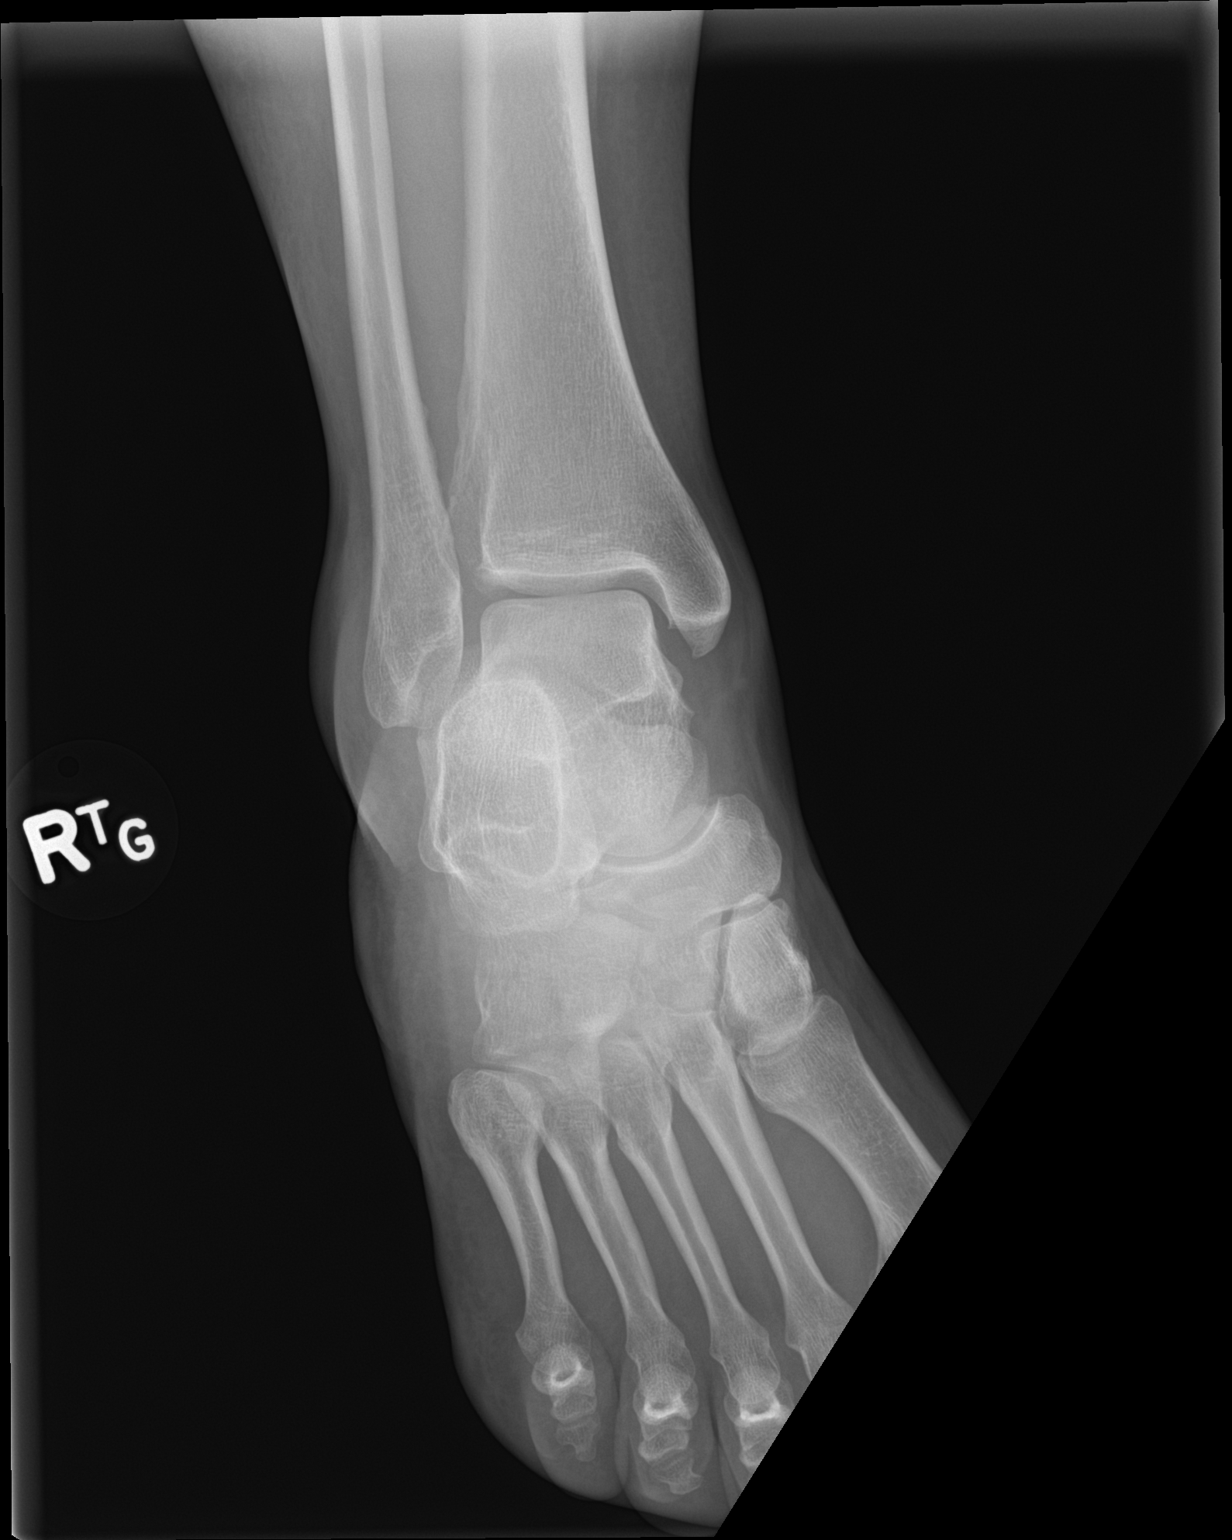

[ankle lat]
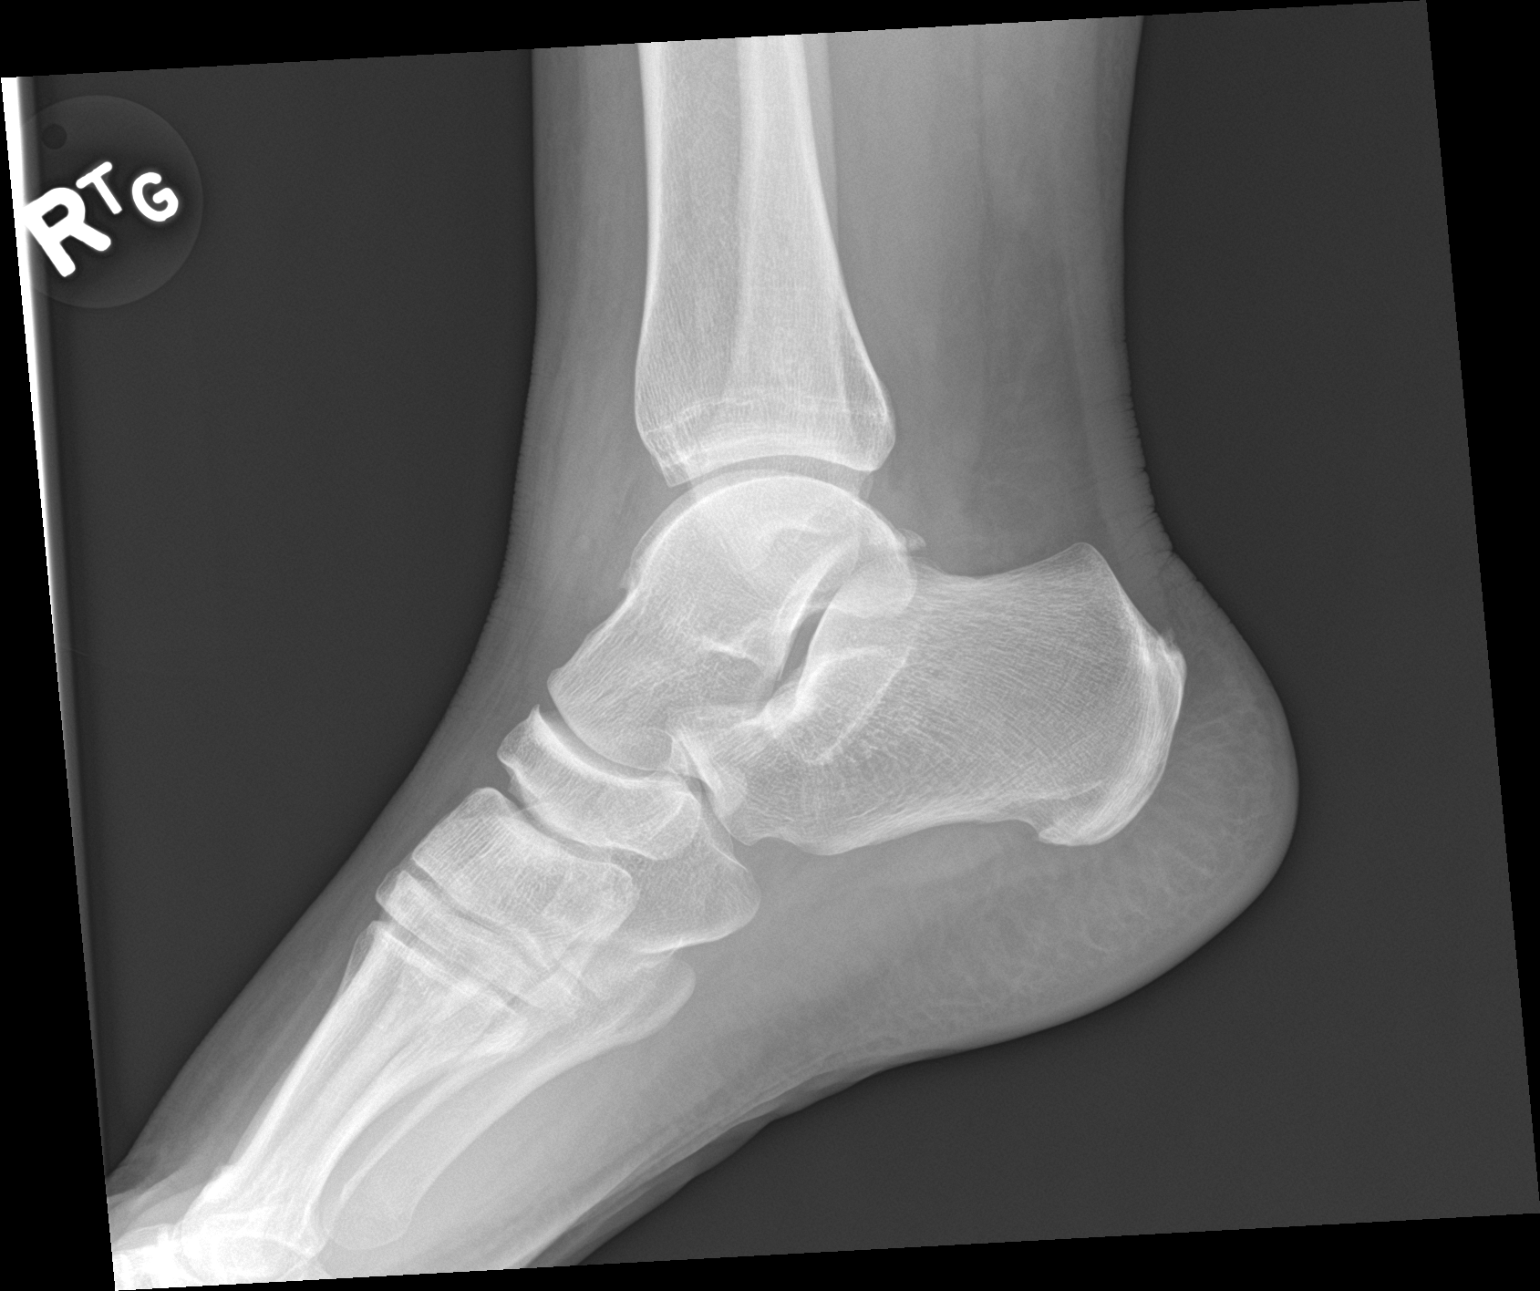

[3 of 3 positions shown; findings below may reference images not displayed]

FINDINGS: Right tibia/fibula: No evidence of fracture or other focal bony
lesion. Joint alignment maintained at the knee and ankle. Regional
soft tissues are unremarkable.

Right ankle: No evidence of fracture or dislocation. Ankle mortise
is intact. Tibiotalar joint space is maintained. Prominent right
lateral ankle soft tissue swelling.

Right foot: No acute fracture or dislocation. No focal bony lesion.
No significant arthropathy. The regional soft tissues are
unremarkable.
IMPRESSION: Prominent right lateral ankle soft tissue swelling. No acute osseous
abnormality identified in the right tibia/fibula, ankle or foot.

## 2021-10-06 LAB — HEPATITIS C ANTIBODY: HCV Ab: NEGATIVE

## 2021-10-06 LAB — OB RESULTS CONSOLE ANTIBODY SCREEN: Antibody Screen: NEGATIVE

## 2021-10-06 LAB — OB RESULTS CONSOLE RPR: RPR: NONREACTIVE

## 2021-10-06 LAB — OB RESULTS CONSOLE RUBELLA ANTIBODY, IGM: Rubella: IMMUNE

## 2021-10-06 LAB — OB RESULTS CONSOLE ABO/RH: RH Type: POSITIVE

## 2021-10-06 LAB — OB RESULTS CONSOLE HEPATITIS B SURFACE ANTIGEN: Hepatitis B Surface Ag: NEGATIVE

## 2021-10-06 LAB — OB RESULTS CONSOLE GC/CHLAMYDIA
Chlamydia: NEGATIVE
Neisseria Gonorrhea: NEGATIVE

## 2021-10-06 LAB — OB RESULTS CONSOLE HIV ANTIBODY (ROUTINE TESTING): HIV: NONREACTIVE

## 2021-10-26 NOTE — L&D Delivery Note (Signed)
Delivery Note Labor onset: 05/01/2022  Labor Onset Time: 1848 Complete dilation at 11:00 PM  Onset of pushing at 2325 FHR second stage Cat 2 Analgesia/Anesthesia intrapartum: unmedicated  Guided pushing with maternal urge. Delivery of a viable female at 2345. Fetal head delivered in LOA position. Newborn with spontaneous cry and good tone.  Nuchal cord: none.  Infant placed on maternal abd, dried, and tactile stim.  Cord double clamped after 2 min and cut by Shon Hale, father.  RN x2 present for birth.  Cord blood sample collected: Yes Arterial cord blood sample collected: No  Placenta delivered Tomasa Blase, intact, with 3 VC.  Placenta to L&D. Uterine tone-fundus firm, atony of LUS, bleeding moderate. TXA 1 G IV, Cytotec 600 mcg PR and 400 buccal. Tone achieved with LUS massage.   No laceration identified.  QBL/EBL (mL): 500 Complications: PPH APGAR: APGAR (1 MIN):  8 APGAR (5 MINS):  9 APGAR (10 MINS):   Mom to postpartum.  Baby to Couplet care / Skin to Skin Baby boy "Elizabeth Chapman" parents desire in-patient circumcision  Roma Schanz DNP, CNM 05/02/2022, 12:31 AM

## 2022-02-26 ENCOUNTER — Other Ambulatory Visit: Payer: Self-pay

## 2022-03-02 ENCOUNTER — Other Ambulatory Visit: Payer: Self-pay | Admitting: Obstetrics & Gynecology

## 2022-03-02 ENCOUNTER — Encounter: Payer: Self-pay | Admitting: *Deleted

## 2022-03-02 DIAGNOSIS — Z363 Encounter for antenatal screening for malformations: Secondary | ICD-10-CM

## 2022-03-05 ENCOUNTER — Ambulatory Visit: Payer: BC Managed Care – PPO | Attending: Obstetrics & Gynecology

## 2022-03-05 ENCOUNTER — Other Ambulatory Visit: Payer: Self-pay | Admitting: *Deleted

## 2022-03-05 ENCOUNTER — Ambulatory Visit: Payer: BC Managed Care – PPO | Admitting: *Deleted

## 2022-03-05 ENCOUNTER — Ambulatory Visit (HOSPITAL_BASED_OUTPATIENT_CLINIC_OR_DEPARTMENT_OTHER): Payer: BC Managed Care – PPO | Admitting: Obstetrics and Gynecology

## 2022-03-05 VITALS — BP 120/78 | HR 99

## 2022-03-05 DIAGNOSIS — O34219 Maternal care for unspecified type scar from previous cesarean delivery: Secondary | ICD-10-CM | POA: Insufficient documentation

## 2022-03-05 DIAGNOSIS — G40909 Epilepsy, unspecified, not intractable, without status epilepticus: Secondary | ICD-10-CM

## 2022-03-05 DIAGNOSIS — O1213 Gestational proteinuria, third trimester: Secondary | ICD-10-CM | POA: Insufficient documentation

## 2022-03-05 DIAGNOSIS — Z6834 Body mass index (BMI) 34.0-34.9, adult: Secondary | ICD-10-CM

## 2022-03-05 DIAGNOSIS — R569 Unspecified convulsions: Secondary | ICD-10-CM | POA: Insufficient documentation

## 2022-03-05 DIAGNOSIS — O99353 Diseases of the nervous system complicating pregnancy, third trimester: Secondary | ICD-10-CM | POA: Insufficient documentation

## 2022-03-05 DIAGNOSIS — O99213 Obesity complicating pregnancy, third trimester: Secondary | ICD-10-CM | POA: Diagnosis not present

## 2022-03-05 DIAGNOSIS — O1210 Gestational proteinuria, unspecified trimester: Secondary | ICD-10-CM | POA: Diagnosis not present

## 2022-03-05 DIAGNOSIS — O99350 Diseases of the nervous system complicating pregnancy, unspecified trimester: Secondary | ICD-10-CM

## 2022-03-05 DIAGNOSIS — Z3A30 30 weeks gestation of pregnancy: Secondary | ICD-10-CM

## 2022-03-05 DIAGNOSIS — Z363 Encounter for antenatal screening for malformations: Secondary | ICD-10-CM | POA: Insufficient documentation

## 2022-03-05 NOTE — Progress Notes (Signed)
Maternal-Fetal Medicine  ? ?Name: Elizabeth Chapman ?DOB: Dec 26, 1985 ?MRN: [id number] ?Referring Provider: Waymon Amato, MD ? ?I had the pleasure of seeing Ms. Levitan [facility] at [facility]. She is G8 [id number] at [place] 6d gestation and is here for ultrasound evaluation and MFM consultation. ? ?Past medical history is significant for seizure disorder.  On 01/18/2022, she had a first episode of seizures while sleeping.  Her husband noticed frothing at the mouth and inability to respond.  Patient was taken to [facility] where she was evaluated.  MRI showed no intracranial lesions.  EEG showed right temporal rhythmic activities consistent with focal seizures. ? ?Patient was advised to take Keppra (levetiracetam) 500 mg twice daily.  She discontinued Keppra after a month and had repeat seizures last week.  She has resumed Keppra again and has not had seizures.  She complains of diarrhea and attributes to Keppra. ? ?Patient has not had seizures in the past. ? ?She does not have hypertension or diabetes or any other chronic medical conditions. ?Incidentally, protein creatinine ratio was increased.  According to patient, the most recent 24-hour urinalysis showed total protein value of 285 g. ? ?Past surgical history: Tonsillectomy, appendectomy, lymph node biopsy (benign).  She has a left wrist ganglion on and will be undergoing surgery after delivery. ?Medications: Prenatal vitamins, Keppra. ?Allergies: No known drug allergies. ?Social history: Denies tobacco or drug or alcohol use.  She has been married 13 years and her husband is in good health.  He is an African-American.  2 of her children have sickle cell trait. ?Family history: Brother has seizures following traumatic head injuries.  No history of venous thromboembolism in the family. ? ?GYN history: History of abnormal Pap smears in the past but no cervical surgeries. ? ?Obstetric history ?2014: Term vaginal delivery of a female infant weighing 5 pounds  and 6 ounces at birth.  Her pregnancy was complicated by fetal growth restriction.  The newborn was discharged with mother after delivery. ?2016: Term cesarean delivery (breech presentation) of a female infant weighing 7 pounds and 13 ounces at birth.  Her daughter has hip dysplasia but no limitations now. ?2021: Term VBAC of a female infant weighing 8 pounds at birth. ?All her children are in good health. ? ?Ultrasound ?Fetal growth is appropriate for gestational age.  Amniotic fluid is normal and good fetal activity seen.  Fetal anatomical survey appears normal but limited by advanced gestational age. ? 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  It does not guarantee that the seizures may disappear after pregnancy.  Patient is continue follow-up with her neurologist after delivery. ? ?I emphasized the importance of taking anticonvulsants regularly to prevent adverse maternal and fetal outcomes.  Uncontrolled seizures can lead to stillbirth and maternal injuries.  I encouraged her to discuss with her neurologist to find alternative medication if she cannot tolerate Keppra. ? ?I reassured her that [place] can be safely taken in pregnancy.   ? ?Limited studies have not shown any increased congenital malformations in the newborn whose mothers have taken keppra during pregnancy. It is also unclear if folic acid metabolism is affected by this drug.. The drug is likely to cross the placenta to the fetus. As the drug is not metabolized in the liver, it is unlikely to interact with other drugs undergoing hepatic metabolism (oral contraceptives). Oral contraceptives should, however, be avoided if possible. Patient is planning  to undergo bilateral tubal sterilization.   ? ?Breastfeeding: No reports are available, but are likely to be excreted in breast milk. Its effects are not known, but probably safe to breastfeed. ? ?Anticonvulsant  levels may need to be increased in pregnancy because of increased blood volume seen in pregnancy, and they should be decreased in the postpartum period. Frequent estimation of drug levels during pregnancy may be required. Additional anticonvulsant (lamictal) may be added if her seizures are not well controlled. ?Mode of delivery ?I reassured her that vaginal delivery is not contraindicated.  Unless patient has recurrent seizures during labor, cesarean section is performed only for obstetric indications ? ?Isolated Proteinuria ?I reassured her that isolated proteinuria in the absence of hypertension and renal disease is not associated with adverse outcomes.  If she remains normotensive, urine protein estimation is not necessary. ? ?Recommendations ?-An appointment was made for her to return in 4 weeks for fetal growth assessment and completion of fetal anatomy. ?-Weekly BPP from [redacted] weeks gestation till delivery that may be performed at your office. ?-Keppra levels to be estimated at her next prenatal visit and increase dosage as necessary after consultation with neurologist. ? ? ?Thank you for consultation.  If you have any questions or concerns, please contact me the [facility].  Consultation including face-to-face (more than 50%) counseling 45 minutes. ? ?

## 2022-04-03 ENCOUNTER — Other Ambulatory Visit: Payer: Self-pay | Admitting: Obstetrics and Gynecology

## 2022-04-03 LAB — OB RESULTS CONSOLE GBS: GBS: NEGATIVE

## 2022-04-07 ENCOUNTER — Ambulatory Visit: Payer: BC Managed Care – PPO | Admitting: *Deleted

## 2022-04-07 ENCOUNTER — Other Ambulatory Visit: Payer: Self-pay | Admitting: Obstetrics and Gynecology

## 2022-04-07 ENCOUNTER — Encounter: Payer: Self-pay | Admitting: *Deleted

## 2022-04-07 ENCOUNTER — Ambulatory Visit: Payer: BC Managed Care – PPO | Attending: Obstetrics

## 2022-04-07 VITALS — BP 110/66 | HR 101

## 2022-04-07 DIAGNOSIS — G40909 Epilepsy, unspecified, not intractable, without status epilepticus: Secondary | ICD-10-CM | POA: Insufficient documentation

## 2022-04-07 DIAGNOSIS — O99213 Obesity complicating pregnancy, third trimester: Secondary | ICD-10-CM | POA: Diagnosis not present

## 2022-04-07 DIAGNOSIS — E669 Obesity, unspecified: Secondary | ICD-10-CM

## 2022-04-07 DIAGNOSIS — Z362 Encounter for other antenatal screening follow-up: Secondary | ICD-10-CM | POA: Insufficient documentation

## 2022-04-07 DIAGNOSIS — O1213 Gestational proteinuria, third trimester: Secondary | ICD-10-CM | POA: Insufficient documentation

## 2022-04-07 DIAGNOSIS — O09523 Supervision of elderly multigravida, third trimester: Secondary | ICD-10-CM | POA: Insufficient documentation

## 2022-04-07 DIAGNOSIS — O99353 Diseases of the nervous system complicating pregnancy, third trimester: Secondary | ICD-10-CM | POA: Insufficient documentation

## 2022-04-07 DIAGNOSIS — O34219 Maternal care for unspecified type scar from previous cesarean delivery: Secondary | ICD-10-CM | POA: Insufficient documentation

## 2022-04-07 DIAGNOSIS — Z6834 Body mass index (BMI) 34.0-34.9, adult: Secondary | ICD-10-CM | POA: Insufficient documentation

## 2022-04-07 DIAGNOSIS — Z3A35 35 weeks gestation of pregnancy: Secondary | ICD-10-CM | POA: Diagnosis not present

## 2022-04-07 NOTE — Procedures (Signed)
Elizabeth Chapman October 30, 1985 [redacted]w[redacted]d  Fetus A Non-Stress Test Interpretation for 04/07/22  Indication: Unsatisfactory BPP and Seizure disorder on Keppra  Fetal Heart Rate A Mode: External Baseline Rate (A): 135 bpm Variability: Moderate Accelerations: 15 x 15 Decelerations: None Multiple birth?: No  Uterine Activity Mode: Palpation, Toco Contraction Frequency (min): Occas Contraction Quality: Mild Resting Tone Palpated: Relaxed Resting Time: Adequate  Interpretation (Fetal Testing) Nonstress Test Interpretation: Reactive Comments: Dr. Annamaria Boots reviewed tracing.

## 2022-04-09 ENCOUNTER — Telehealth (HOSPITAL_COMMUNITY): Payer: Self-pay | Admitting: *Deleted

## 2022-04-09 NOTE — Telephone Encounter (Signed)
Preadmission screen  

## 2022-04-10 ENCOUNTER — Encounter (HOSPITAL_COMMUNITY): Payer: Self-pay | Admitting: *Deleted

## 2022-05-01 ENCOUNTER — Inpatient Hospital Stay (HOSPITAL_COMMUNITY)
Admission: RE | Admit: 2022-05-01 | Discharge: 2022-05-03 | DRG: 807 | Disposition: A | Discharge status: Home / Self Care | Payer: BC Managed Care – PPO | Attending: Obstetrics and Gynecology | Admitting: Obstetrics and Gynecology

## 2022-05-01 ENCOUNTER — Other Ambulatory Visit: Payer: Self-pay

## 2022-05-01 ENCOUNTER — Encounter (HOSPITAL_COMMUNITY): Payer: Self-pay | Admitting: Obstetrics and Gynecology

## 2022-05-01 ENCOUNTER — Inpatient Hospital Stay (HOSPITAL_COMMUNITY): Payer: BC Managed Care – PPO

## 2022-05-01 DIAGNOSIS — Z3A39 39 weeks gestation of pregnancy: Secondary | ICD-10-CM | POA: Diagnosis not present

## 2022-05-01 DIAGNOSIS — Z87891 Personal history of nicotine dependence: Secondary | ICD-10-CM

## 2022-05-01 DIAGNOSIS — O99214 Obesity complicating childbirth: Secondary | ICD-10-CM | POA: Diagnosis present

## 2022-05-01 DIAGNOSIS — O99354 Diseases of the nervous system complicating childbirth: Secondary | ICD-10-CM | POA: Diagnosis present

## 2022-05-01 DIAGNOSIS — O34219 Maternal care for unspecified type scar from previous cesarean delivery: Secondary | ICD-10-CM | POA: Diagnosis present

## 2022-05-01 DIAGNOSIS — G40909 Epilepsy, unspecified, not intractable, without status epilepticus: Secondary | ICD-10-CM

## 2022-05-01 LAB — CBC
HCT: 34.6 % — ABNORMAL LOW (ref 36.0–46.0)
Hemoglobin: 12.1 g/dL (ref 12.0–15.0)
MCH: 30.9 pg (ref 26.0–34.0)
MCHC: 35 g/dL (ref 30.0–36.0)
MCV: 88.3 fL (ref 80.0–100.0)
Platelets: 164 10*3/uL (ref 150–400)
RBC: 3.92 MIL/uL (ref 3.87–5.11)
RDW: 14.7 % (ref 11.5–15.5)
WBC: 10.4 10*3/uL (ref 4.0–10.5)
nRBC: 0 % (ref 0.0–0.2)

## 2022-05-01 LAB — LACTATE DEHYDROGENASE: LDH: 129 U/L (ref 98–192)

## 2022-05-01 LAB — COMPREHENSIVE METABOLIC PANEL
ALT: 14 U/L (ref 0–44)
AST: 19 U/L (ref 15–41)
Albumin: 2.9 g/dL — ABNORMAL LOW (ref 3.5–5.0)
Alkaline Phosphatase: 95 U/L (ref 38–126)
Anion gap: 10 (ref 5–15)
BUN: <5 mg/dL — ABNORMAL LOW (ref 6–20)
CO2: 24 mmol/L (ref 22–32)
Calcium: 9 mg/dL (ref 8.9–10.3)
Chloride: 105 mmol/L (ref 98–111)
Creatinine, Ser: 0.68 mg/dL (ref 0.44–1.00)
GFR, Estimated: >60 mL/min (ref >60)
Glucose, Bld: 80 mg/dL (ref 70–99)
Potassium: 3.8 mmol/L (ref 3.5–5.1)
Sodium: 139 mmol/L (ref 135–145)
Total Bilirubin: 0.4 mg/dL (ref 0.3–1.2)
Total Protein: 6 g/dL — ABNORMAL LOW (ref 6.5–8.1)

## 2022-05-01 LAB — TYPE AND SCREEN
ABO/RH(D): O POS
Antibody Screen: NEGATIVE

## 2022-05-01 LAB — PROTEIN / CREATININE RATIO, URINE
Creatinine, Urine: 19.16 mg/dL
Total Protein, Urine: <6 mg/dL

## 2022-05-01 LAB — URIC ACID: Uric Acid, Serum: 4.7 mg/dL (ref 2.5–7.1)

## 2022-05-01 LAB — RPR: RPR Ser Ql: NONREACTIVE

## 2022-05-01 MED ORDER — FENTANYL CITRATE (PF) 100 MCG/2ML IJ SOLN
100.0000 ug | INTRAMUSCULAR | Status: DC | PRN
  Administered 2022-05-02: 100 ug via INTRAVENOUS
  Filled 2022-05-01: qty 2

## 2022-05-01 MED ORDER — TERBUTALINE SULFATE 1 MG/ML IJ SOLN: 0.2500 mg | Freq: Once | INTRAMUSCULAR | Status: DC | PRN

## 2022-05-01 MED ORDER — LACTATED RINGERS IV SOLN: INTRAVENOUS | Status: DC

## 2022-05-01 MED ORDER — OXYTOCIN-SODIUM CHLORIDE 30-0.9 UT/500ML-% IV SOLN
1.0000 m[IU]/min | INTRAVENOUS | Status: DC
  Administered 2022-05-01: 2 m[IU]/min via INTRAVENOUS
  Filled 2022-05-01: qty 500

## 2022-05-01 MED ORDER — LEVETIRACETAM 500 MG PO TABS
500.0000 mg | ORAL_TABLET | Freq: Two times a day (BID) | ORAL | Status: DC
  Administered 2022-05-01: 500 mg via ORAL
  Filled 2022-05-01: qty 1

## 2022-05-01 MED ORDER — FLEET ENEMA 7-19 GM/118ML RE ENEM: 1.0000 | ENEMA | RECTAL | Status: DC | PRN

## 2022-05-01 MED ORDER — OXYTOCIN BOLUS FROM INFUSION
333.0000 mL | Freq: Once | INTRAVENOUS | Status: AC
  Administered 2022-05-01: 333 mL via INTRAVENOUS

## 2022-05-01 MED ORDER — LIDOCAINE HCL (PF) 1 % IJ SOLN: 30.0000 mL | INTRAMUSCULAR | Status: DC | PRN

## 2022-05-01 MED ORDER — ACETAMINOPHEN 325 MG PO TABS: 650.0000 mg | ORAL_TABLET | ORAL | Status: DC | PRN

## 2022-05-01 MED ORDER — SOD CITRATE-CITRIC ACID 500-334 MG/5ML PO SOLN: 30.0000 mL | ORAL | Status: DC | PRN

## 2022-05-01 MED ORDER — OXYTOCIN-SODIUM CHLORIDE 30-0.9 UT/500ML-% IV SOLN
2.5000 [IU]/h | INTRAVENOUS | Status: DC
  Filled 2022-05-01: qty 500

## 2022-05-01 MED ORDER — ONDANSETRON HCL 4 MG/2ML IJ SOLN: 4.0000 mg | Freq: Four times a day (QID) | INTRAMUSCULAR | Status: DC | PRN

## 2022-05-01 MED ORDER — LACTATED RINGERS IV SOLN: 500.0000 mL | INTRAVENOUS | Status: DC | PRN

## 2022-05-01 NOTE — Progress Notes (Signed)
OB Progress Note  S: Patient resting comfortably. Contractions are not significantly painful. Consents to AROM. Open to epidural but has never had one before.  O: BP 124/70   Pulse 83   Temp 98.3 F (36.8 C) (Oral)   Resp 18   Ht 5\' 2"  (1.575 m)   Wt 101 kg   LMP 08/01/2021   BMI 40.73 kg/m   FHT: 125bpm, moderate variablity, + accels, - decels Toco: q2-3 minutes SVE: 6/80/-2, sutures palpated, cervix posterior AROM: Clear, non-odorous  A/P: 36 y.o. 31 @ [redacted]w[redacted]d admitted for induction of labor/TOLAC due to seizure disorder (on Keppra).  FWB: Cat. I Labor course: Pitocin at 56mU/min, s/p FB, AROM now Pain: Per patient request GBS: Negative Anticipate SVD  13m, DO

## 2022-05-01 NOTE — Progress Notes (Addendum)
Elizabeth Chapman is a 36 y.o. [id number] at [redacted]w[redacted]d    Subjective: Feels ctxs but not strongly (3/10).  Foley balloon attempted to be placed in cervix at approx 1345 without success.    Objective: BP 117/69   Pulse 79   Temp 98 F (36.7 C) (Oral)   Resp 18   Ht 5\' 2"  (1.575 m)   Wt 101 kg   LMP 08/01/2021   BMI 40.73 kg/m  No intake/output data recorded. No intake/output data recorded.  FHT:  FHR: 130 bpm, variability: moderate,  accelerations:  Present,  decelerations:  Absent UC:   q4-57min SVE:   Dilation: 1 Effacement (%): 30 Station: -2 Exam by:: Grasiela Jonsson  Labs: Lab Results  Component Value Date   WBC 10.4 05/01/2022   HGB 12.1 05/01/2022   HCT 34.6 (L) 05/01/2022   MCV 88.3 05/01/2022   PLT 164 05/01/2022    Assessment / Plan: Induction of labor due to AMA, h/o seizure disorder and increased BMI currently on pitocin at 74mu/min.    Labor:  May increase pitocin to ctxs approx q78min, foley balloon able to be placed (@ 1500) with speculum in place and patient in stirrups using ringed forceps .  Pt tolerated insertion well.  Plan to leave in for 12hrs per protocol unless comes out sooner. Preeclampsia:   n/a Fetal Wellbeing:  Category I Pain Control:   plans to labor without an epidural but may have an epidural, nitrous or IV pain medication.  Options discussed with patient and 1m, RN informed. I/D:   GBS neg Anticipated MOD:  NSVD H/o seizures last dose of keppra taken at 0530.  Ordered q12 with next dose at 1730.  Currently stable and has been followed by neurology.  Maralyn Sago, MD 05/01/2022, 3:05 PM

## 2022-05-01 NOTE — Progress Notes (Signed)
Tracing reviewed remotely at approx 0900 - cat 1

## 2022-05-01 NOTE — Progress Notes (Signed)
Subjective:    Coping well with labor, breathing and changing positions. Reports feeling more pressure and agrees to VE.   Objective:    VS: BP 133/76   Pulse 99   Temp 98 F (36.7 C) (Oral)   Resp 16   Ht 5\' 2"  (1.575 m)   Wt 101 kg   LMP 08/01/2021   SpO2 98%   BMI 40.73 kg/m  FHR : baseline 140 / variability moderate / accelerations present / absent decelerations Toco: contractions every 3-4 minutes  Membranes: AROM x3 hrs Dilation: Lip/rim Effacement (%): 90, 100 Cervical Position: Posterior Station: Plus 1 Presentation: Vertex Exam by:: [email or web address], CNM Pitocin 16 mU/min  Assessment/Plan:   36 y.o. 31 [redacted]w[redacted]d IOL for hx of seizure disorder Prev C/S desires TOLAC, hx of successful VBAC  Labor: Progressing normally Preeclampsia:  GHTN ruled out with labs and blood pressure readings Fetal Wellbeing:  Category I Pain Control:  Nitrous Oxide I/D:   GBS neg Anticipated MOD:  NSVD  [redacted]w[redacted]d DNP, CNM 05/01/2022 9:39 PM

## 2022-05-01 NOTE — H&P (Addendum)
Elizabeth Chapman is a 36 y.o. female presenting for induction of labor with h/o c-section and VBAC x1.  Denies LOF or VB.  Last seizure was around 26wks and currently stable on keppra.  OB History     Gravida  7   Para  3   Term  3   Preterm      AB  3   Living  3      SAB  3   IAB      Ectopic      Multiple  0   Live Births  3          Past Medical History:  Diagnosis Date   Abnormal Pap smear AGE 10   COLPO; LAST PAP 2011   Hx of varicella    Infection    UTI X 1   Infection 2007   X 1   Medical history non-contributory    Seizures (HCC)    2 nocturnal seizures with pregnancy on Kepra   Vaginal delivery 2014   Vaginal Pap smear, abnormal    Past Surgical History:  Procedure Laterality Date   APPENDECTOMY  AGE 30   CESAREAN SECTION N/A 06/19/2015   Procedure: CESAREAN SECTION;  Surgeon: Hoover Browns, MD;  Location: [facility];  Service: Obstetrics;  Laterality: N/A;   DILATION AND EVACUATION N/A 07/16/2014   Procedure: DILATATION AND EVACUATION;  Surgeon: Purcell Nails, MD;  Location: [facility];  Service: Gynecology;  Laterality: N/A;   DILATION AND EVACUATION N/A 05/27/2018   Procedure: Suction DILATATION AND EVACUATION;  Surgeon: Essie Hart, MD;  Location: [facility];  Service: Gynecology;  Laterality: N/A;   LYMPH NODES REMOVED FROM NECK  AGE 30   SUPERFICIAL LYMPH NODE BIOPSY / EXCISION     TONSILLECTOMY     TONSILLECTOMY  AGE 2   Family History: family history includes Early death in her father; Epilepsy in her brother; Learning disabilities in her sister. Social History:  reports that she quit smoking about 15 years ago. Her smoking use included cigarettes. She smoked an average of .3 packs per day. She has never used smokeless tobacco. She reports that she does not currently use alcohol. She reports that she does not use drugs.     Maternal Diabetes: No Genetic Screening: Declined Maternal Ultrasounds/Referrals: Normal Fetal  Ultrasounds or other Referrals:  None Maternal Substance Abuse:  No Significant Maternal Medications:  Meds include: Other: Keppra Significant Maternal Lab Results:  Group B Strep negative and Other: Keppra level 9.9 03-10-22 and UPCR 0.4 02-26-22 with <300 total protein in 24hr urine. Other Comments:  None  Review of Systems Denies F/C/N/V/D  History Dilation: 1 Effacement (%): 50 Station: -3 Exam by:: Earlene Plater, RN Blood pressure 112/69, pulse 89, temperature 98 F (36.7 C), temperature source Oral, resp. rate 16, height 5\' 2"  (1.575 m), weight 101 kg, last menstrual period 08/01/2021, unknown if currently breastfeeding. Exam Physical Exam  Lungs CTA CV RRR Abdomen gravid, NT Ext no calf tenderness  Prenatal labs: ABO, Rh: --/--/O POS (07/07 0745) Antibody: NEG (07/07 0745) Rubella: Immune (12/12 0000) RPR: Nonreactive (12/12 0000)  HBsAg: Negative (12/12 0000)  HIV: Non-reactive (12/12 0000)  GBS: Negative/-- (06/09 0000)   Assessment/Plan: 04-19-1999 at 39wks being admitted for IOL d/t h/o seizure disorder this pregnancy now controlled on keppra, AMA and increased BMI with h/o c-section and VBAC x1.  Will start with pitocin.  Cat 1 tracing.  Pain medication upon request.  Will order PIH  labs and Urine PCR d/t h/o elevated AST/ALT which subsequently normalized and elevated UPCR this pregnancy without elevated BPs or lab abnormalities reflective of preeclampsia.  Plans sterilization and consent s/w in office chart on 03-26-22. Cat 1 tracing.  Purcell Nails 05/01/2022, 8:48 AM

## 2022-05-02 ENCOUNTER — Encounter (HOSPITAL_COMMUNITY): Payer: Self-pay | Admitting: Obstetrics and Gynecology

## 2022-05-02 LAB — CBC
HCT: 29.6 % — ABNORMAL LOW (ref 36.0–46.0)
Hemoglobin: 10.5 g/dL — ABNORMAL LOW (ref 12.0–15.0)
MCH: 31 pg (ref 26.0–34.0)
MCHC: 35.5 g/dL (ref 30.0–36.0)
MCV: 87.3 fL (ref 80.0–100.0)
Platelets: 194 K/uL (ref 150–400)
RBC: 3.39 MIL/uL — ABNORMAL LOW (ref 3.87–5.11)
RDW: 14.6 % (ref 11.5–15.5)
WBC: 21.2 K/uL — ABNORMAL HIGH (ref 4.0–10.5)
nRBC: 0 % (ref 0.0–0.2)

## 2022-05-02 MED ORDER — ACETAMINOPHEN 325 MG PO TABS
650.0000 mg | ORAL_TABLET | ORAL | Status: DC | PRN
  Administered 2022-05-02 (×2): 650 mg via ORAL
  Filled 2022-05-02 (×2): qty 2

## 2022-05-02 MED ORDER — SIMETHICONE 80 MG PO CHEW: 80.0000 mg | CHEWABLE_TABLET | ORAL | Status: DC | PRN

## 2022-05-02 MED ORDER — BENZOCAINE-MENTHOL 20-0.5 % EX AERO
1.0000 | INHALATION_SPRAY | Freq: Four times a day (QID) | CUTANEOUS | Status: DC
  Administered 2022-05-02 – 2022-05-03 (×3): 1 via TOPICAL
  Filled 2022-05-02: qty 56

## 2022-05-02 MED ORDER — MISOPROSTOL 200 MCG PO TABS
ORAL_TABLET | ORAL | Status: AC
  Administered 2022-05-02: 600 ug via RECTAL
  Administered 2022-05-02: 400 ug via BUCCAL
  Filled 2022-05-02: qty 5

## 2022-05-02 MED ORDER — ONDANSETRON HCL 4 MG/2ML IJ SOLN: 4.0000 mg | INTRAMUSCULAR | Status: DC | PRN

## 2022-05-02 MED ORDER — WITCH HAZEL-GLYCERIN EX PADS: 1.0000 | MEDICATED_PAD | CUTANEOUS | Status: DC | PRN

## 2022-05-02 MED ORDER — SENNOSIDES-DOCUSATE SODIUM 8.6-50 MG PO TABS
2.0000 | ORAL_TABLET | ORAL | Status: DC
  Administered 2022-05-02 – 2022-05-03 (×2): 2 via ORAL
  Filled 2022-05-02 (×2): qty 2

## 2022-05-02 MED ORDER — OXYCODONE HCL 5 MG PO TABS: 5.0000 mg | ORAL_TABLET | ORAL | Status: DC | PRN

## 2022-05-02 MED ORDER — TRANEXAMIC ACID-NACL 1000-0.7 MG/100ML-% IV SOLN
INTRAVENOUS | Status: AC
  Administered 2022-05-02: 1000 mg
  Filled 2022-05-02: qty 100

## 2022-05-02 MED ORDER — DIPHENHYDRAMINE HCL 25 MG PO CAPS: 25.0000 mg | ORAL_CAPSULE | Freq: Four times a day (QID) | ORAL | Status: DC | PRN

## 2022-05-02 MED ORDER — ONDANSETRON HCL 4 MG PO TABS: 4.0000 mg | ORAL_TABLET | ORAL | Status: DC | PRN

## 2022-05-02 MED ORDER — TETANUS-DIPHTH-ACELL PERTUSSIS 5-2.5-18.5 LF-MCG/0.5 IM SUSY: 0.5000 mL | PREFILLED_SYRINGE | Freq: Once | INTRAMUSCULAR | Status: DC

## 2022-05-02 MED ORDER — PRENATAL MULTIVITAMIN CH
1.0000 | ORAL_TABLET | Freq: Every day | ORAL | Status: DC
  Administered 2022-05-02 – 2022-05-03 (×2): 1 via ORAL
  Filled 2022-05-02 (×2): qty 1

## 2022-05-02 MED ORDER — ZOLPIDEM TARTRATE 5 MG PO TABS: 5.0000 mg | ORAL_TABLET | Freq: Every evening | ORAL | Status: DC | PRN

## 2022-05-02 MED ORDER — IBUPROFEN 600 MG PO TABS
600.0000 mg | ORAL_TABLET | Freq: Four times a day (QID) | ORAL | Status: DC
  Administered 2022-05-02 – 2022-05-03 (×5): 600 mg via ORAL
  Filled 2022-05-02 (×6): qty 1

## 2022-05-02 MED ORDER — METHYLERGONOVINE MALEATE 0.2 MG PO TABS
0.2000 mg | ORAL_TABLET | ORAL | Status: AC
  Administered 2022-05-02 – 2022-05-03 (×5): 0.2 mg via ORAL
  Filled 2022-05-02 (×5): qty 1

## 2022-05-02 MED ORDER — DIBUCAINE (PERIANAL) 1 % EX OINT: 1.0000 | TOPICAL_OINTMENT | CUTANEOUS | Status: DC | PRN

## 2022-05-02 MED ORDER — LEVETIRACETAM 500 MG PO TABS
500.0000 mg | ORAL_TABLET | Freq: Two times a day (BID) | ORAL | Status: DC
  Administered 2022-05-02 – 2022-05-03 (×3): 500 mg via ORAL
  Filled 2022-05-02 (×4): qty 1

## 2022-05-02 MED ORDER — LEVETIRACETAM 500 MG PO TABS
500.0000 mg | ORAL_TABLET | Freq: Two times a day (BID) | ORAL | Status: DC
  Filled 2022-05-02: qty 1

## 2022-05-02 MED ORDER — COCONUT OIL OIL: 1.0000 | TOPICAL_OIL | Status: DC | PRN

## 2022-05-02 NOTE — Progress Notes (Signed)
At approximately 0320 first hour assessment was performed and patient stated she was having tingles in her face and cheeks, no difficulty breathing present, no dizziness, no visual disturbances present. Vital signs stable, see flow record. This symptom only lasted a few minutes and then subsided. Lizbeth Bark CNM notified of patient symptoms, no new orders received. Will continue to monitor for remainder of shift.

## 2022-05-02 NOTE — Lactation Note (Signed)
This note was copied from a baby's chart. Lactation Consultation Note  Patient Name: Elizabeth Chapman MBOMQ'T Date: 05/02/2022 Reason for consult: Initial assessment Age:36 hours  P4, Ex Bf for 2 years with each child. Mother states baby recently breastfed off and on for an hours.  Denies questions or concerns. Mother will call if help is needed. Reviewed engorgement care and monitoring voids/stools.   Maternal Data Has patient been taught Hand Expression?: Yes Does the patient have breastfeeding experience prior to this delivery?: Yes How long did the patient breastfeed?: 2 years  Feeding Mother's Current Feeding Choice: Breast Milk   Interventions Interventions: Education;LC Services brochure  Discharge Discharge Education: Engorgement and breast care;Warning signs for feeding baby  Consult Status Consult Status: PRN    Dahlia Byes [facility] 05/02/2022, 8:18 AM

## 2022-05-02 NOTE — Progress Notes (Signed)
Postpartum Note Day #1  S:  Patient doing well.  Pain controlled.  Tolerating regular diet.   Ambulating and voiding without difficulty. Reports tingling in her cheeks bilaterally - not normally blushed-appearing. This has not occurred prior to a seizure before. Otherwise, she feels very well. Denies fevers, chills, chest pain, SOB, N/V, or worsening bilateral LE edema.  Lochia: Minimal Infant feeding:  Breast Circumcision:  Desires prior to discharge Contraception:  Partner - vasectomy  O: Temp:  [97.2 F (36.2 C)-100.1 F (37.8 C)] 98.5 F (36.9 C) (07/08 0740) Pulse Rate:  [79-118] 85 (07/08 0740) Resp:  [16-18] 17 (07/08 0316) BP: (100-133)/(57-84) 100/60 (07/08 0740) SpO2:  [97 %-98 %] 98 % (07/08 0316) Gen: NAD, pleasant and cooperative, cheeks appear mildly flushed CV: RRR Resp: CTAB, no wheezes/rales/rhonchi Abdomen: soft, non-distended, non-tender throughout Uterus: firm, non-tender, below umbilicus Ext: No bilateral LE edema, no bilateral calf tenderness  Labs:  Recent Labs    05/01/22 0745 05/02/22 0503  HGB 12.1 10.5*  HCT 34.6* 29.6*    A/P: Patient is a 36 y.o. [id number] PPD#1 s/p SVD.  S/p SVD - Pain well controlled  - GU: UOP is adequate - GI: Tolerating regular diet - Activity: encouraged sitting up to chair and ambulation as tolerated - DVT Prophylaxis: Ambulation - Labs: as above  Seizure disorder - Medication: Keppra 500mg  BID (ordered)  - Asymptomatic  Facial tingling - No other symptoms present other than mild flushing of cheeks - If symptoms worsen, counseled patient to notify primary RN  Circumcision consent: Routine circumcisions performed on newborns have been identified as voluntary, elective procedures by such as the [employer].  It is considered an elective procedure with no definitive medical indication and carries risks.  Risks include but are not limited to bleeding, infection, damage to  penis with possible need for further surgery, poor cosmesis, and local anesthetic risks.  Circumcision will only be performed if patient is deemed to have normal anatomy by his Pediatrician, meets adequate criteria for a newborn of similar gestational age after birth and is without infection or other medical issue contraindicating an elective procedure.   Patient understands and agrees with above consent Patient discussed with parents of infant.   Disposition:  D/C home tomorrow.  [employer], DO [phone] (office)

## 2022-05-03 MED ORDER — IBUPROFEN 600 MG PO TABS: 600.0000 mg | ORAL_TABLET | Freq: Four times a day (QID) | ORAL | 1 refills | Status: AC | PRN

## 2022-05-03 NOTE — Lactation Note (Signed)
This note was copied from a baby's chart. Lactation Consultation Note  Patient Name: Elizabeth Chapman VOHYW'V Date: 05/03/2022 Reason for consult: Follow-up assessment;Term Age:36 hours  P4 mother whose infant is now 74 hours old.  This is a term baby at 39+0 weeks.  Mother's current feeding preference is breast.  She breast fed her other children for 2 years each.  NP requested assistance; mother was a prn status but agreed to speak with me.    There was a concern that baby had not fed in 6 hours.  However, after speaking with mother, he cluster fed last night.  She attempted to feed him prior to circumcision but he was too sleepy.  When I arrived mother reported that he just had a good 20 minute feeding and was asleep on her chest.  Praised mother for her efforts and encouraged continued attempts with feedings today.  Mother had no questions/concerns.  She is not interested in supplementing; I do not feel this is necessary at this time.    Mother desires a discharge.  She is awaiting a hearing screen.  Bilirubin level was drawn today and resulted as 10.2 mg/dl at 33 hours of life.  Mother has our OP phone number for any concerns after discharge.  She is familiar with engorgement and did not need review.  No support person present at this time.   Maternal Data    Feeding Mother's Current Feeding Choice: Breast Milk  LATCH Score                    Lactation Tools Discussed/Used    Interventions    Discharge Discharge Education: Engorgement and breast care  Consult Status Consult Status: Complete    Eris Breck R Shirline Kendle 05/03/2022, 10:03 AM

## 2022-05-03 NOTE — Discharge Summary (Signed)
Postpartum Discharge Summary  Date of Service: 05/03/22     Patient Name: Elizabeth Chapman DOB: 03-Jan-1986 MRN: [id number]  Date of admission: 05/01/2022 Delivery date:05/01/2022  Delivering provider: Burman Foster B  Date of discharge: 05/03/2022  Admitting diagnosis: Seizure disorder ([place]) [G40.909] Intrauterine pregnancy: [redacted]w[redacted]d    Secondary diagnosis:  Principal Problem: S/p VBAC Active Problems:   Seizure disorder ([place]   PPH (postpartum hemorrhage)  Additional problems: Obesity (BMI  40)  Discharge diagnosis: Term Pregnancy Delivered                                              Post partum procedures: None Augmentation: AROM and Pitocin Complications: None  Hospital course: Induction of Labor With Vaginal Delivery   36y.o. yo [id number] admitted to the hospital 05/01/2022 for induction of labor.  Indication for induction:  Seizure disorder .  Patient had an uncomplicated labor course as follows: Membrane Rupture Time/Date: 6:48 PM ,05/01/2022   Delivery Method:VBAC, Spontaneous  Episiotomy: None  Lacerations:  None  Details of delivery can be found in separate delivery note.  Patient had a routine postpartum course. Patient is discharged home 05/03/22.  Newborn Data: Birth date:05/01/2022  Birth time:11:45 PM  Gender:Female  Living status:Living  Apgars:8 ,9  Weight:3460 g   Magnesium Sulfate received: No BMZ received: No Rhophylac:N/A MMR:N/A T-DaP: See prenatal records Flu: n/a Transfusion:No  Physical exam  Vitals:   05/02/22 1145 05/02/22 1636 05/02/22 1950 05/03/22 0500  BP: 113/71 113/74 102/68 (!) 98/59  Pulse: 84 81 96 74  Resp:  _0 Temp: 98.7 F (37.1 C) 97.8 F (36.6 C)    TempSrc: Oral Oral    SpO2:   100% 100%  Weight:      Height:       General: alert, cooperative, and no distress Lochia: appropriate Uterine Fundus: firm Incision: N/A DVT Evaluation: No evidence of DVT seen on physical exam. No cords or calf  tenderness. Labs: Lab Results  Component Value Date   WBC 21.2 (H) 05/02/2022   HGB 10.5 (L) 05/02/2022   HCT 29.6 (L) 05/02/2022   MCV 87.3 05/02/2022   PLT 194 05/02/2022      Latest Ref Rng & Units 05/01/2022    3:42 PM  CMP  Glucose 70 - 99 mg/dL 80   BUN 6 - 20 mg/dL <5   Creatinine 0.44 - 1.00 mg/dL 0.68   Sodium 135 - 145 mmol/L 139   Potassium 3.5 - 5.1 mmol/L 3.8   Chloride 98 - 111 mmol/L 105   CO2 22 - 32 mmol/L 24   Calcium 8.9 - 10.3 mg/dL 9.0   Total Protein 6.5 - 8.1 g/dL 6.0   Total Bilirubin 0.3 - 1.2 mg/dL 0.4   Alkaline Phos 38 - 126 U/L 95   AST 15 - 41 U/L 19   ALT 0 - 44 U/L 14    Edinburgh Score:    05/02/2022    3:16 AM  Edinburgh Postnatal Depression Scale Screening Tool  I have been able to laugh and see the funny side of things. 0  I have looked forward with enjoyment to things. 0  I have blamed myself unnecessarily when things went wrong. 0  I have been anxious or worried for no good reason. 0  I have felt scared or panicky for no  good reason. 0  Things have been getting on top of me. 0  I have been so unhappy that I have had difficulty sleeping. 0  I have felt sad or miserable. 0  I have been so unhappy that I have been crying. 0  The thought of harming myself has occurred to me. 0  Edinburgh Postnatal Depression Scale Total 0      After visit meds:  Allergies as of 05/03/2022   No Known Allergies      Medication List     TAKE these medications    ibuprofen 600 MG tablet Commonly known as: ADVIL Take 1 tablet (600 mg total) by mouth every 6 (six) hours as needed for mild pain, moderate pain or cramping.   levETIRAcetam 500 MG tablet Commonly known as: KEPPRA Take 500 mg by mouth 2 (two) times daily.   multivitamin-prenatal 27-0.8 MG Tabs tablet Take 1 tablet by mouth daily at 12 noon.         Discharge home in stable condition Infant Feeding: Breast Infant Disposition:home with mother Discharge instruction: per After  Visit Summary and Postpartum booklet. Activity: Advance as tolerated. Pelvic rest for 6 weeks.  Diet: routine diet Anticipated Birth Control:  Partner to have vasectomy Postpartum Appointment: 5 weeks Additional Postpartum F/U:  None Future Appointments:No future appointments. Follow up Visit:  Follow-up Information     Ob/Gyn, [place] Follow up in 5 week(s).   Specialty: Obstetrics and Gynecology Why: Please keep your previously scheduled postpartum visit with [facility]. Contact information: [address] [place] [address] [phone]                     05/03/2022 Drema Dallas, DO

## 2022-05-09 ENCOUNTER — Telehealth (HOSPITAL_COMMUNITY): Payer: Self-pay

## 2022-05-09 NOTE — Telephone Encounter (Signed)
Patient reports feeling good. Patient declines questions/concerns about her health and healing.  Patient reports that baby is doing well. Eating, peeing/pooping, and gaining weight well. Baby sleeps in a pack n play. RN reviewed ABC's of safe sleep with patient. Patient declines any questions or concerns about baby.  EPDS score is 0.  Marcelino Duster [facility]  05/09/2022,1151
